# Patient Record
Sex: Male | Born: 1940 | Race: Black or African American | Hispanic: No | Marital: Single | Smoking: Former smoker
Health system: Southern US, Community
[De-identification: ages and names within clinical notes are randomized; demographics above are authoritative.]

## PROBLEM LIST (undated history)

## (undated) DIAGNOSIS — I10 Essential (primary) hypertension: Secondary | ICD-10-CM

## (undated) HISTORY — PX: ANAL FISSURE REPAIR: SHX2312

## (undated) HISTORY — PX: COLONOSCOPY: SHX174

---

## 1962-07-20 HISTORY — PX: OTHER SURGICAL HISTORY: SHX169

## 2011-03-13 ENCOUNTER — Ambulatory Visit (INDEPENDENT_AMBULATORY_CARE_PROVIDER_SITE_OTHER): Payer: Medicare Other | Admitting: Surgery

## 2011-03-13 ENCOUNTER — Encounter (INDEPENDENT_AMBULATORY_CARE_PROVIDER_SITE_OTHER): Payer: Self-pay | Admitting: Surgery

## 2011-03-13 VITALS — BP 160/94 | HR 62 | Ht 70.0 in | Wt 182.8 lb

## 2011-03-13 DIAGNOSIS — K644 Residual hemorrhoidal skin tags: Secondary | ICD-10-CM

## 2011-03-13 MED ORDER — TRAMADOL HCL 50 MG PO TABS: 50.0000 mg | ORAL_TABLET | Freq: Four times a day (QID) | ORAL | Status: DC | PRN

## 2011-03-13 NOTE — Progress Notes (Signed)
Subjective:     Patient ID: Sufyaan Palma, male   DOB: 03/20/1941, 70 y.o.   MRN: [id number]  HPI The patient presents today due to an anal skin tag. It causes local irritation after bowel movements. He denies any severe pain. He has had an anal fissure in the past.  He denies rectal bleeding or drainage.  Bowel movements are normal.   Review of Systems  Constitutional: Negative.   HENT: Negative.   Eyes: Negative.   Respiratory: Negative.   Cardiovascular: Negative.   Gastrointestinal: Negative.        Objective:   Physical Exam  Constitutional: He appears well-developed and well-nourished.  HENT:  Head: Normocephalic and atraumatic.  Nose: Nose normal.  Eyes: Conjunctivae and EOM are normal. Pupils are equal, round, and reactive to light.  Genitourinary:       The patient has a small skin tag in the left lateral anal canal. No evidence of hemorrhoids. No evidence of anal fissure.  Skin: Skin is warm and dry.       Assessment:     Anal skin tag    Plan:     The patient wishes to have this excised today in the clinic. Is causing local irritation and hygiene issues. I discussed the procedure with the patient. Risks of bleeding, infection, postoperative pain, postoperative drainage from the anal canal and recurrence. He understands the potential risks of procedure and agrees to proceed. Under sterile conditions, 1% lidocaine with epinephrine was used objective the left anal canal. 3 cc were used. Anal skin tag was grasped with pickups and excised with scissors. Hemostasis achieved with pressure. He tolerated the procedure well. He will begin sitz baths 3 times a day. I will give him a prescription for pain medicine. I've asked him to refrain from excessive straining and keep a dry gauze in place. He will return as needed or if he has increasing pain or increasing fever.

## 2011-03-13 NOTE — Progress Notes (Signed)
This encounter was created in error - please disregard.

## 2011-03-13 NOTE — Patient Instructions (Signed)
Sitz baths 2-3 times a day.  Baby wipes.  Avoid straining.  Call if temp greater than 101.5 or increasing pain.  Keep a dry gauze in area as needed.

## 2011-03-13 NOTE — Progress Notes (Signed)
Addended by: Harriette Bouillon A on: 03/13/2011 11:29 AM   Modules accepted: Level of Service, SmartSet

## 2011-03-13 NOTE — Patient Instructions (Signed)
You will be scheduled for surgery.

## 2011-03-13 NOTE — Progress Notes (Signed)
Subjective:     Patient ID: Jonathan Obrien, male   DOB: 26-Apr-1941, 70 y.o.   MRN: [id number]  HPI The patient presents to to rectal pain. He was last seen back in 2010 and he has had a history of anal fissure. Back healed but he had significant hyperactivity of his anal sphincter. Last week while on vacation he began to have more pain after bowel movements. The pain is located in his anal canal. It is severe. There is no associated bleeding or discharge. The pain occurs after bowel movements. His last colonoscopy was in 2006 for small hyperplastic polyps.  Review of Systems  Constitutional: Negative.   HENT: Negative.   Eyes: Negative.   Cardiovascular: Negative.   Gastrointestinal: Positive for rectal pain. Negative for blood in stool.  Genitourinary: Negative.   Musculoskeletal: Negative.   Skin: Negative.   Neurological: Negative.   Hematological: Negative.   Psychiatric/Behavioral: Negative.        Objective:   Physical Exam  Constitutional: He appears well-developed and well-nourished.  HENT:  Head: Normocephalic and atraumatic.  Nose: Nose normal.  Eyes: EOM are normal. Pupils are equal, round, and reactive to light.  Neck: Normal range of motion. Thyromegaly present.  Cardiovascular: Normal rate, regular rhythm and normal heart sounds.  Exam reveals no friction rub.   No murmur heard. Pulmonary/Chest: Effort normal and breath sounds normal.  Abdominal: Soft. Bowel sounds are normal.  Genitourinary:       Increased activity of the internal anal sphincter on digital exam. No mass. No abscess. No fissure. No evidence of hemorrhoid disease.  Skin: Skin is warm and dry.  Psychiatric: He has a normal mood and affect. His behavior is normal. Judgment normal.       Assessment:     Proctalgia paroxysmal    Plan:     I have recommended exam under anesthesia and lateral internal sphincterotomy for paroxysmal proctalgia secondary to hyperactivity of the internal sphincter the  anal canal. Risk of bleeding, infection, incontinence, and recurrence of pain discussed with patient. Medical options discussed with patient. He wishes to proceed with surgery.

## 2011-03-20 ENCOUNTER — Other Ambulatory Visit (INDEPENDENT_AMBULATORY_CARE_PROVIDER_SITE_OTHER): Payer: Self-pay | Admitting: Surgery

## 2011-03-20 ENCOUNTER — Ambulatory Visit
Admission: RE | Admit: 2011-03-20 | Discharge: 2011-03-20 | Disposition: A | Discharge status: Home / Self Care | Payer: Medicare Other | Source: Ambulatory Visit | Attending: Surgery | Admitting: Surgery

## 2011-03-20 DIAGNOSIS — Z01811 Encounter for preprocedural respiratory examination: Secondary | ICD-10-CM

## 2011-03-26 DIAGNOSIS — K602 Anal fissure, unspecified: Secondary | ICD-10-CM

## 2011-09-15 ENCOUNTER — Emergency Department (HOSPITAL_BASED_OUTPATIENT_CLINIC_OR_DEPARTMENT_OTHER)
Admission: EM | Admit: 2011-09-15 | Discharge: 2011-09-15 | Disposition: A | Discharge status: Home / Self Care | Payer: Medicare Other | Attending: Emergency Medicine | Admitting: Emergency Medicine

## 2011-09-15 ENCOUNTER — Encounter (HOSPITAL_BASED_OUTPATIENT_CLINIC_OR_DEPARTMENT_OTHER): Payer: Self-pay

## 2011-09-15 DIAGNOSIS — M778 Other enthesopathies, not elsewhere classified: Secondary | ICD-10-CM

## 2011-09-15 DIAGNOSIS — I10 Essential (primary) hypertension: Secondary | ICD-10-CM

## 2011-09-15 DIAGNOSIS — S139XXA Sprain of joints and ligaments of unspecified parts of neck, initial encounter: Secondary | ICD-10-CM | POA: Insufficient documentation

## 2011-09-15 DIAGNOSIS — M65839 Other synovitis and tenosynovitis, unspecified forearm: Secondary | ICD-10-CM | POA: Insufficient documentation

## 2011-09-15 DIAGNOSIS — S161XXA Strain of muscle, fascia and tendon at neck level, initial encounter: Secondary | ICD-10-CM

## 2011-09-15 DIAGNOSIS — Y92009 Unspecified place in unspecified non-institutional (private) residence as the place of occurrence of the external cause: Secondary | ICD-10-CM | POA: Insufficient documentation

## 2011-09-15 DIAGNOSIS — X58XXXA Exposure to other specified factors, initial encounter: Secondary | ICD-10-CM | POA: Insufficient documentation

## 2011-09-15 NOTE — ED Notes (Signed)
Pt c/o right wrist pain that increases with movement. Also c/o posterior neck pain x 2 weeks.

## 2011-09-15 NOTE — Discharge Instructions (Signed)
Cervical Sprain and Strain A cervical sprain is an injury to the neck. The injury can include either over-stretching or even small tears in the ligaments that hold the bones of the neck in place. A strain affects muscles and tendons. Minor injuries usually only involve ligaments and muscles. Because the different parts of the neck are so close together, more severe injuries can involve both sprain and strain. These injuries can affect the muscles, ligaments, tendons, discs, and nerves in the neck. CAUSES  An injury may be the result of a direct blow or from certain habits that can lead to the symptoms noted above.  Injury from:   Contact sports (such as football, rugby, wrestling, hockey, auto racing, gymnastics, diving, martial arts, and boxing).   Motor vehicle accidents.   Whiplash injuries (see image at right). These are common. They occur when the neck is forcefully whipped or forced backward and/or forward.   Falls.   Lifestyle or awkward postures:   Cradling a telephone between the ear and shoulder.   Sitting in a chair that offers no support.   Working at an ill-designed computer station.   Activities that require hours of repeated or long periods of looking up (stretching the neck backward) or looking down (bending the head/neck forward).  SYMPTOMS   Pain, soreness, stiffness, or burning sensation in the front, back, or sides of the neck. This may develop immediately after injury. Onset of discomfort may also develop slowly and not begin for 24 hours or more.   Shoulder and/or upper back pain.   Limits to the normal movement of the neck.   Headache.   Dizziness.   Weakness and/or abnormal sensation (such as numbness or tingling) of one or both arms and/or hands.   Muscle spasm.   Difficulty with swallowing or chewing.   Tenderness and swelling at the injury site.  DIAGNOSIS  Most of the time, your caregiver can diagnose this problem with a careful history and  examination. The history will include information about known problems (such as arthritis in the neck) or a previous neck injury. X-rays may be ordered to find out if there is a different problem. X-rays can also help to find problems with the bones of the neck not related to the injury or current symptoms. TREATMENT  Several treatment options are available to help pain, spasm, and other symptoms. They include:  Cold helps relieve pain and reduce inflammation. Cold should be applied for 10 to 15 minutes every 2 to 3 hours after any activity that aggravates your symptoms. Use ice packs or an ice massage. Place a towel or cloth in between your skin and the ice pack.   Medication:   Only take over-the-counter or prescription medicines for pain, discomfort, or fever as directed by your caregiver.   Pain relievers or muscle relaxants may be prescribed. Use only as directed and only as much as you need.   Change in the activity that caused the problem. This might include using a headset with a telephone so that the phone is not propped between your ear and shoulder.   Neck collar. Your caregiver may recommend temporary use of a soft cervical collar.   Work station. Changes may be needed in your work place. A better sitting position and/or better posture during work may be part of your treatment.   Physical Therapy. Your caregiver may recommend physical therapy. This can include instructions in the use of stretching and strengthening exercises. Improvement in posture is important.   Exercises and posture training can help stabilize the neck and strengthen muscles and keep symptoms from returning.  HOME CARE INSTRUCTIONS  Other than formal physical therapy, all treatments above can be done at home. Even when not at work, it is important to be conscious of your posture and of activities that can cause a return of symptoms. Most cervical sprains and/or strains are better in 1-3 weeks. As you improve and  increase activities, doing a warm up and stretching before the activity will help prevent recurrent problems. SEEK MEDICAL CARE IF:   Pain is not effectively controlled with medication.   You feel unable to decrease pain medication over time as planned.   Activity level is not improving as planned and/or expected.  SEEK IMMEDIATE MEDICAL CARE IF:   While using medication, you develop any bleeding, stomach upset, or signs of an allergic reaction.   Symptoms get worse, become intolerable, and are not helped by medications.   New, unexplained symptoms develop.   You experience numbness, tingling, weakness, or paralysis of any part of your body.  MAKE SURE YOU:   Understand these instructions.   Will watch your condition.   Will get help right away if you are not doing well or get worse.  Document Released: 05/03/2007 Document Revised: 03/18/2011 Document Reviewed: 05/03/2007 [facility] Patient Information 2012 [place], [place].Arterial Hypertension Arterial hypertension (high blood pressure) is a condition of elevated pressure in your blood vessels. Hypertension over a long period of time is a risk factor for strokes, heart attacks, and heart failure. It is also the leading cause of kidney (renal) failure.  CAUSES   In Adults -- Over 90% of all hypertension has no known cause. This is called essential or primary hypertension. In the other 10% of people with hypertension, the increase in blood pressure is caused by another disorder. This is called secondary hypertension. Important causes of secondary hypertension are:   Heavy alcohol use.   Obstructive sleep apnea.   Hyperaldosterosim (Conn's syndrome).   Steroid use.   Chronic kidney failure.   Hyperparathyroidism.   Medications.   Renal artery stenosis.   Pheochromocytoma.   Cushing's disease.   Coarctation of the aorta.   Scleroderma renal crisis.   Licorice (in excessive amounts).   Drugs (cocaine,  methamphetamine).  Your caregiver can explain any items above that apply to you.  In Children -- Secondary hypertension is more common and should always be considered.   Pregnancy -- Few women of childbearing age have high blood pressure. However, up to 10% of them develop hypertension of pregnancy. Generally, this will not harm the woman. It may be a sign of 3 complications of pregnancy: preeclampsia, HELLP syndrome, and eclampsia. Follow up and control with medication is necessary.  SYMPTOMS   This condition normally does not produce any noticeable symptoms. It is usually found during a routine exam.   Malignant hypertension is a late problem of high blood pressure. It may have the following symptoms:   Headaches.   Blurred vision.   End-organ damage (this means your kidneys, heart, lungs, and other organs are being damaged).   Stressful situations can increase the blood pressure. If a person with normal blood pressure has their blood pressure go up while being seen by their caregiver, this is often termed "white coat hypertension." Its importance is not known. It may be related with eventually developing hypertension or complications of hypertension.   Hypertension is often confused with mental tension, stress, and anxiety.  DIAGNOSIS  The diagnosis is made by 3 separate blood pressure measurements. They are taken at least 1 week apart from each other. If there is organ damage from hypertension, the diagnosis may be made without repeat measurements. Hypertension is usually identified by having blood pressure readings:  Above 140/90 mmHg measured in both arms, at 3 separate times, over a couple weeks.   Over 130/80 mmHg should be considered a risk factor and may require treatment in patients with diabetes.  Blood pressure readings over 120/80 mmHg are called "pre-hypertension" even in non-diabetic patients. To get a true blood pressure measurement, use the following guidelines. Be  aware of the factors that can alter blood pressure readings.  Take measurements at least 1 hour after caffeine.   Take measurements 30 minutes after smoking and without any stress. This is another reason to quit smoking - it raises your blood pressure.   Use a proper cuff size. Ask your caregiver if you are not sure about your cuff size.   Most home blood pressure cuffs are automatic. They will measure systolic and diastolic pressures. The systolic pressure is the pressure reading at the start of sounds. Diastolic pressure is the pressure at which the sounds disappear. If you are elderly, measure pressures in multiple postures. Try sitting, lying or standing.   Sit at rest for a minimum of 5 minutes before taking measurements.   You should not be on any medications like decongestants. These are found in many cold medications.   Record your blood pressure readings and review them with your caregiver.  If you have hypertension:  Your caregiver may do tests to be sure you do not have secondary hypertension (see "causes" above).   Your caregiver may also look for signs of metabolic syndrome. This is also called Syndrome X or Insulin Resistance Syndrome. You may have this syndrome if you have type 2 diabetes, abdominal obesity, and abnormal blood lipids in addition to hypertension.   Your caregiver will take your medical and family history and perform a physical exam.   Diagnostic tests may include blood tests (for glucose, cholesterol, potassium, and kidney function), a urinalysis, or an EKG. Other tests may also be necessary depending on your condition.  PREVENTION  There are important lifestyle issues that you can adopt to reduce your chance of developing hypertension:  Maintain a normal weight.   Limit the amount of salt (sodium) in your diet.   Exercise often.   Limit alcohol intake.   Get enough potassium in your diet. Discuss specific advice with your caregiver.   Follow a DASH  diet (dietary approaches to stop hypertension). This diet is rich in fruits, vegetables, and low-fat dairy products, and avoids certain fats.  PROGNOSIS  Essential hypertension cannot be cured. Lifestyle changes and medical treatment can lower blood pressure and reduce complications. The prognosis of secondary hypertension depends on the underlying cause. Many people whose hypertension is controlled with medicine or lifestyle changes can live a normal, healthy life.  RISKS AND COMPLICATIONS  While high blood pressure alone is not an illness, it often requires treatment due to its short- and long-term effects on many organs. Hypertension increases your risk for:  CVAs or strokes (cerebrovascular accident).   Heart failure due to chronically high blood pressure (hypertensive cardiomyopathy).   Heart attack (myocardial infarction).   Damage to the retina (hypertensive retinopathy).   Kidney failure (hypertensive nephropathy).  Your caregiver can explain list items above that apply to you. Treatment of hypertension can significantly reduce  the risk of complications. TREATMENT   For overweight patients, weight loss and regular exercise are recommended. Physical fitness lowers blood pressure.   Mild hypertension is usually treated with diet and exercise. A diet rich in fruits and vegetables, fat-free dairy products, and foods low in fat and salt (sodium) can help lower blood pressure. Decreasing salt intake decreases blood pressure in a 1/3 of people.   Stop smoking if you are a smoker.  The steps above are highly effective in reducing blood pressure. While these actions are easy to suggest, they are difficult to achieve. Most patients with moderate or severe hypertension end up requiring medications to bring their blood pressure down to a normal level. There are several classes of medications for treatment. Blood pressure pills (antihypertensives) will lower blood pressure by their different  actions. Lowering the blood pressure by 10 mmHg may decrease the risk of complications by as much as 25%. The goal of treatment is effective blood pressure control. This will reduce your risk for complications. Your caregiver will help you determine the best treatment for you according to your lifestyle. What is excellent treatment for one person, may not be for you. HOME CARE INSTRUCTIONS   Do not smoke.   Follow the lifestyle changes outlined in the "Prevention" section.   If you are on medications, follow the directions carefully. Blood pressure medications must be taken as prescribed. Skipping doses reduces their benefit. It also puts you at risk for problems.   Follow up with your caregiver, as directed.   If you are asked to monitor your blood pressure at home, follow the guidelines in the "Diagnosis" section above.  SEEK MEDICAL CARE IF:   You think you are having medication side effects.   You have recurrent headaches or lightheadedness.   You have swelling in your ankles.   You have trouble with your vision.  SEEK IMMEDIATE MEDICAL CARE IF:   You have sudden onset of chest pain or pressure, difficulty breathing, or other symptoms of a heart attack.   You have a severe headache.   You have symptoms of a stroke (such as sudden weakness, difficulty speaking, difficulty walking).  MAKE SURE YOU:   Understand these instructions.   Will watch your condition.   Will get help right away if you are not doing well or get worse.  Document Released: 07/06/2005 Document Revised: 03/18/2011 Document Reviewed: 02/03/2007 [facility] Patient Information 2012 [place], [place].Hypertension Information As your heart beats, it forces blood through your arteries. This force is your blood pressure. If the pressure is too high, it is called hypertension (HTN) or high blood pressure. HTN is dangerous because you may have it and not know it. High blood pressure may mean that your heart has to work  harder to pump blood. Your arteries may be narrow or stiff. The extra work puts you at risk for heart disease, stroke, and other problems.  Blood pressure consists of two numbers, a higher number over a lower, 110/72, for example. It is stated as "110 over 72." The ideal is below 120 for the top number (systolic) and under 80 for the bottom (diastolic).  You should pay close attention to your blood pressure if you have certain conditions such as:  Heart failure.   Prior heart attack.   Diabetes   Chronic kidney disease.   Prior stroke.   Multiple risk factors for heart disease.  To see if you have HTN, your blood pressure should be measured while you are  seated with your arm held at the level of the heart. It should be measured at least twice. A one-time elevated blood pressure reading (especially in the Emergency Department) does not mean that you need treatment. There may be conditions in which the blood pressure is different between your right and left arms. It is important to see your caregiver soon for a recheck. Most people have essential hypertension which means that there is not a specific cause. This type of high blood pressure may be lowered by changing lifestyle factors such as:  Stress.   Smoking.   Lack of exercise.   Excessive weight.   Drug/tobacco/alcohol use.   Eating less salt.  Most people do not have symptoms from high blood pressure until it has caused damage to the body. Effective treatment can often prevent, delay or reduce that damage. TREATMENT  Treatment for high blood pressure, when a cause has been identified, is directed at the cause. There are a large number of medications to treat HTN. These fall into several categories, and your caregiver will help you select the medicines that are best for you. Medications may have side effects. You should review side effects with your caregiver. If your blood pressure stays high after you have made lifestyle changes  or started on medicines,   Your medication(s) may need to be changed.   Other problems may need to be addressed.   Be certain you understand your prescriptions, and know how and when to take your medicine.   Be sure to follow up with your caregiver within the time frame advised (usually within two weeks) to have your blood pressure rechecked and to review your medications.   If you are taking more than one medicine to lower your blood pressure, make sure you know how and at what times they should be taken. Taking two medicines at the same time can result in blood pressure that is too low.  Document Released: 09/08/2005 Document Revised: 03/18/2011 Document Reviewed: 09/15/2007 [facility] Patient Information 2012 [place], [place].Tendinitis Tendinitis is swelling and inflammation of the tendons. Tendons are band-like tissues that connect muscle to bone. Tendinitis commonly occurs in the:   Shoulders (rotator cuff).   Heels (Achilles tendon).   Elbows (triceps tendon).  CAUSES Tendinitis is usually caused by overusing the tendon, muscles, and joints involved. When the tissue surrounding a tendon (synovium) becomes inflamed, it is called tenosynovitis. Tendinitis commonly develops in people whose jobs require repetitive motions. SYMPTOMS  Pain.   Tenderness.   Mild swelling.  DIAGNOSIS Tendinitis is usually diagnosed by physical exam. Your caregiver may also order X-rays or other imaging tests. TREATMENT Your caregiver may recommend certain medicines or exercises for your treatment. HOME CARE INSTRUCTIONS   Use a sling or splint for as long as directed by your caregiver until the pain decreases.   Put ice on the injured area.   Put ice in a plastic bag.   Place a towel between your skin and the bag.   Leave the ice on for 15 to 20 minutes, 3 to 4 times a day.   Avoid using the limb while the tendon is painful. Perform gentle range of motion exercises only as directed by your  caregiver. Stop exercises if pain or discomfort increase, unless directed otherwise by your caregiver.   Only take over-the-counter or prescription medicines for pain, discomfort, or fever as directed by your caregiver.  SEEK MEDICAL CARE IF:   Your pain and swelling increase.   You develop new,  unexplained symptoms, especially increased numbness in the hands.  MAKE SURE YOU:   Understand these instructions.   Will watch your condition.   Will get help right away if you are not doing well or get worse.  Document Released: 07/03/2000 Document Revised: 03/18/2011 Document Reviewed: 09/22/2010 [facility] Patient Information 2012 [place], [place].

## 2011-09-15 NOTE — ED Provider Notes (Signed)
History     CSN: [id number]  Arrival date & time 09/15/11  1124   First MD Initiated Contact with Patient 09/15/11 1218      Chief Complaint  Patient presents with  . 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 No language interpreter was used.    History reviewed. No pertinent past medical history.  Past Surgical History  Procedure Date  . Anal fissure repair   . Left leg surgery 1964    No family history on file.  History  Substance Use Topics  . Smoking status: Former Smoker    Quit date: 03/12/1981  . Smokeless tobacco: Not on file  . Alcohol Use: No      Review of Systems  All other systems reviewed and are negative.    Allergies  Codeine and Cortizone-5  Home Medications   Current Outpatient Rx  Name Route Sig Dispense Refill  . TRAMADOL HCL 50 MG PO TABS Oral Take 1 tablet (50 mg total) by mouth every 6 (six) hours as needed for pain. 20 tablet 0    There were no vitals taken for this visit.  Physical Exam  Nursing note and vitals reviewed. Constitutional: He is oriented to person, place, and time. He appears well-developed and well-nourished.  HENT:  Head: Normocephalic and atraumatic.  Eyes: Conjunctivae and EOM are normal. Pupils are equal, round, and reactive to light.  Neck: Normal range of motion. Neck supple.  Cardiovascular: Normal rate and regular rhythm.   Pulmonary/Chest: Effort normal and breath sounds normal.  Musculoskeletal: Normal range of  motion.       Pt tender on the right lateral neck:pt has full rom:pt tender on the left lateral wrist:no swelling noted:pt has rull rom  Neurological: He is alert and oriented to person, place, and time.  Skin: Skin is warm and dry.  Psychiatric: He has a normal mood and affect.    ED Course  Procedures (including critical care time)  Labs Reviewed - No data to display No results found.   1. Tendonitis of wrist, left   2. Cervical strain   3. Hypertension       MDM  Don't think imaging is needed at this time:likely tendonitis of wrist based on use:pt like has cervical strain:pt has not had fall or injury:pt refusing pain medication:discussed bp with pt        Teressa Lower, NP 09/15/11 1242

## 2011-09-16 NOTE — ED Provider Notes (Signed)
Medical screening examination/treatment/procedure(s) were performed by non-physician practitioner and as supervising physician I was immediately available for consultation/collaboration.   Geoffery Lyons, MD 09/16/11 1640

## 2011-09-17 ENCOUNTER — Telehealth: Payer: Self-pay | Admitting: Internal Medicine

## 2011-09-17 ENCOUNTER — Encounter: Payer: Self-pay | Admitting: Internal Medicine

## 2011-09-17 ENCOUNTER — Ambulatory Visit (INDEPENDENT_AMBULATORY_CARE_PROVIDER_SITE_OTHER): Payer: Medicare Other | Admitting: Internal Medicine

## 2011-09-17 DIAGNOSIS — I10 Essential (primary) hypertension: Secondary | ICD-10-CM | POA: Insufficient documentation

## 2011-09-17 DIAGNOSIS — Z79899 Other long term (current) drug therapy: Secondary | ICD-10-CM

## 2011-09-17 DIAGNOSIS — Z13220 Encounter for screening for lipoid disorders: Secondary | ICD-10-CM

## 2011-09-17 LAB — CBC WITH DIFFERENTIAL/PLATELET
Basophils Absolute: 0 10*3/uL (ref 0.0–0.1)
Basophils Relative: 1 % (ref 0–1)
Lymphocytes Relative: 30 % (ref 12–46)
MCHC: 31.8 g/dL (ref 30.0–36.0)
Neutro Abs: 3.2 10*3/uL (ref 1.7–7.7)
Neutrophils Relative %: 53 % (ref 43–77)
Platelets: 204 10*3/uL (ref 150–400)
RDW: 15.9 % — ABNORMAL HIGH (ref 11.5–15.5)
WBC: 6.1 10*3/uL (ref 4.0–10.5)

## 2011-09-17 LAB — BASIC METABOLIC PANEL
BUN: 14 mg/dL (ref 6–23)
Potassium: 4.6 mEq/L (ref 3.5–5.3)
Sodium: 139 mEq/L (ref 135–145)

## 2011-09-17 MED ORDER — AMLODIPINE BESYLATE 5 MG PO TABS: 5.0000 mg | ORAL_TABLET | Freq: Every day | ORAL | Status: DC

## 2011-09-17 NOTE — Telephone Encounter (Signed)
Lab order entered for April 2013.  

## 2011-09-17 NOTE — Patient Instructions (Signed)
Please schedule fasting lipid prior to next visit (401.9)

## 2011-09-17 NOTE — Progress Notes (Signed)
  Subjective:    Patient ID: Jonathan Obrien, male    DOB: [birth year], 71 y.o.   MRN: [id number]  HPI Pt presents to clinic for evaluation of elevated bp. Notes chronic h/o HTN for at least years. Has never been treated. Is asx without ha, dizziness, cp, neurologic sx or dyspnea. Recently seen in ED with wrist and neck pain. dx'ed with tendinitis and neck strain. Wrist pain resolved with nsaid and neck pain improved. No radicular arm pain or injury. Taking no medication currently for the problem but using heat. bp in ED was significantly elevated near sbp 200. Self monitoring values are sbp ~160. No other complaints.  PMH:h/o anal fissure HTN  Past Surgical History  Procedure Date  . Anal fissure repair   . Left leg surgery 1964  . Colonoscopy     reports that he quit smoking about 30 years ago. He has never used smokeless tobacco. He reports that he drinks alcohol. He reports that he does not use illicit drugs. family history includes Diabetes in his brother.  There is no history of Prostate cancer, and Hypertension, and Breast cancer, and Colon cancer, and Heart disease, . Allergies  Allergen Reactions  . Codeine Rash    With aspirin  . Cortizone-5 (Hydrocortisone Base) Rash     Review of Systems  Constitutional: Negative for fatigue.  Respiratory: Negative for shortness of breath.   Cardiovascular: Negative for chest pain.  Neurological: Negative for dizziness and headaches.  All other systems reviewed and are negative.       Objective:   Physical Exam  Nursing note and vitals reviewed. Constitutional: He appears well-developed and well-nourished. No distress.  HENT:  Head: Normocephalic and atraumatic.  Right Ear: External ear normal.  Left Ear: External ear normal.  Mouth/Throat: Oropharynx is clear and moist. No oropharyngeal exudate.  Eyes: Conjunctivae and EOM are normal. Pupils are equal, round, and reactive to light. No scleral icterus.  Neck: Neck supple. No JVD  present. Carotid bruit is not present. No thyromegaly present.  Cardiovascular: Normal rate, regular rhythm and normal heart sounds.  Exam reveals no gallop and no friction rub.   No murmur heard. Pulmonary/Chest: Effort normal and breath sounds normal. No respiratory distress. He has no wheezes. He has no rales.  Lymphadenopathy:    He has no cervical adenopathy.  Neurological: He is alert.  Skin: Skin is warm and dry. He is not diaphoretic.  Psychiatric: He has a normal mood and affect.          Assessment & Plan:

## 2011-09-17 NOTE — Assessment & Plan Note (Signed)
Long discussion held regarding diagnosis, treatment and potential complications of untreated HTN. Obtain cbc, chem7. EKG obtained demonstrating nsr with nl intervals and axis. Discussed low sodium diet and exercise. Recommend begin medication and states will consider. Provided with prescription for norvasc. Monitor bp as outpt and follow up in clinic as scheduled.

## 2011-10-26 ENCOUNTER — Ambulatory Visit (INDEPENDENT_AMBULATORY_CARE_PROVIDER_SITE_OTHER): Payer: Medicare Other | Admitting: Internal Medicine

## 2011-10-26 ENCOUNTER — Encounter: Payer: Self-pay | Admitting: Internal Medicine

## 2011-10-26 VITALS — BP 172/100 | HR 62 | Temp 98.2°F | Resp 18 | Ht 70.0 in | Wt 190.0 lb

## 2011-10-26 DIAGNOSIS — I10 Essential (primary) hypertension: Secondary | ICD-10-CM

## 2011-10-26 MED ORDER — AMLODIPINE BESYLATE 10 MG PO TABS: 10.0000 mg | ORAL_TABLET | Freq: Every day | ORAL | Status: DC

## 2011-10-26 NOTE — Patient Instructions (Signed)
Please schedule fasting lipid- 401.9 prior to next appointment

## 2011-10-26 NOTE — Progress Notes (Signed)
  Subjective:    Patient ID: Jonathan Obrien, male    DOB: [birth year], 71 y.o.   MRN: [id number]  HPI Pt presents to clinic for follow up of HTN. Tolerating norvasc without side effect. Reviewed bp log-~150-160 with occasional higher or lower numbers. Has been taking norvasc consistently for 3 weeks and recent lost his mother. Hasn't been able to exercise recently. No active complaint.  No past medical history on file. Past Surgical History  Procedure Date  . Anal fissure repair   . Left leg surgery 1964  . Colonoscopy     reports that he quit smoking about 30 years ago. He has never used smokeless tobacco. He reports that he drinks alcohol. He reports that he does not use illicit drugs. family history includes Diabetes in his brother.  There is no history of Prostate cancer, and Hypertension, and Breast cancer, and Colon cancer, and Heart disease, . Allergies  Allergen Reactions  . Codeine Rash    With aspirin  . Cortizone-5 (Hydrocortisone Base) Rash     Review of Systems see hpi     Objective:   Physical Exam  Physical Exam  Nursing note and vitals reviewed. Constitutional: Appears well-developed and well-nourished. No distress.  HENT:  Head: Normocephalic and atraumatic.  Right Ear: External ear normal.  Left Ear: External ear normal.  Eyes: Conjunctivae are normal. No scleral icterus.  Neck: Neck supple. Carotid bruit is not present.  Cardiovascular: Normal rate, regular rhythm and normal heart sounds.  Exam reveals no gallop and no friction rub.   No murmur heard. Pulmonary/Chest: Effort normal and breath sounds normal. No respiratory distress. He has no wheezes. no rales.  Lymphadenopathy:    He has no cervical adenopathy.  Neurological:Alert.  Skin: Skin is warm and dry. Not diaphoretic.  Psychiatric: Has a normal mood and affect.       Assessment & Plan:

## 2011-10-26 NOTE — Assessment & Plan Note (Signed)
suboptimal control. Asx. Increase norvasc 10mg  qd. Call bp log to clinic ~2wks. Obtain lipid profile prior to next visit

## 2011-11-18 ENCOUNTER — Ambulatory Visit (INDEPENDENT_AMBULATORY_CARE_PROVIDER_SITE_OTHER): Payer: Medicare Other | Admitting: Family

## 2011-11-18 ENCOUNTER — Encounter: Payer: Self-pay | Admitting: Family

## 2011-11-18 VITALS — BP 180/84 | HR 60 | Temp 97.9°F | Resp 16 | Ht 70.0 in | Wt 190.0 lb

## 2011-11-18 DIAGNOSIS — I10 Essential (primary) hypertension: Secondary | ICD-10-CM

## 2011-11-18 MED ORDER — LISINOPRIL-HYDROCHLOROTHIAZIDE 10-12.5 MG PO TABS: 1.0000 | ORAL_TABLET | Freq: Every day | ORAL | Status: DC

## 2011-11-18 NOTE — Patient Instructions (Addendum)
Please follow up with Dr. Rodena Medin in 2 weeks.

## 2011-11-18 NOTE — Assessment & Plan Note (Signed)
Deteriorated.  HR is 60 today- will avoid addition of beta blocker. Will continue amlodipine, add lisinopril/hctz 10/12.5.  Follow up in 2 weeks with Dr. Rodena Medin for follow up BP and BMET.  He was advised on rare chance of angioedema on ACE.  Instructed to discontinue and to the ED immediately if this occurs.  Pt verbalizes understanding.

## 2011-11-18 NOTE — Progress Notes (Signed)
  Subjective:    Patient ID: Jonathan Obrien, male    DOB: April 22, 1941, 71 y.o.   MRN: [id number]  HPI  Jonathan Obrien is a 71 yr old male who presents today to discuss his elevated blood pressure.    HTN- Last visit pt's blood pressure was noted to be above goal.  At that time his amlodipine was increased from 5mg  to 10mg .  He reports tolerating this increased dose without difficulty. However, yesterday he checked his BP with his home meter and reports reading of 221/99.  Denies HA, edema.  He denies shortness of breath or chest pain.  Took last dose of amlodipine yesterday evening.  Review of Systems See HPI  No past medical history on file.  History   Social History  . Marital Status: Single    Spouse Name: N/A    Number of Children: N/A  . Years of Education: N/A   Occupational History  . Not on file.   Social History Main Topics  . Smoking status: Former Smoker    Quit date: 03/12/1981  . Smokeless tobacco: Never Used  . Alcohol Use: Yes     occasional  . Drug Use: No  . Sexually Active: Not on file   Other Topics Concern  . Not on file   Social History Narrative  . No narrative on file    Past Surgical History  Procedure Date  . Anal fissure repair   . Left leg surgery 1964  . Colonoscopy     Family History  Problem Relation Age of Onset  . Prostate cancer Neg Hx   . Diabetes Brother   . Hypertension Neg Hx   . Breast cancer Neg Hx   . Colon cancer Neg Hx   . Heart disease Neg Hx     Allergies  Allergen Reactions  . Codeine Rash    With aspirin  . Cortizone-5 (Hydrocortisone Base) Rash    Current Outpatient Prescriptions on File Prior to Visit  Medication Sig Dispense Refill  . amLODipine (NORVASC) 10 MG tablet Take 1 tablet (10 mg total) by mouth daily.  30 tablet  11  . lisinopril-hydrochlorothiazide (PRINZIDE,ZESTORETIC) 10-12.5 MG per tablet Take 1 tablet by mouth daily.  30 tablet  1    BP 180/84  Pulse 60  Temp(Src) 97.9 F (36.6 C)  (Oral)  Resp 16  Ht 5\' 10"  (1.778 m)  Wt 190 lb (86.183 kg)  BMI 27.26 kg/m2  SpO2 99%       Objective:   Physical Exam  Constitutional: He appears well-developed and well-nourished. No distress.  Cardiovascular: Normal rate and regular rhythm.   No murmur heard. Pulmonary/Chest: Effort normal and breath sounds normal. No respiratory distress. He has no wheezes. He has no rales. He exhibits no tenderness.  Musculoskeletal: He exhibits no edema.  Psychiatric: He has a normal mood and affect. His behavior is normal. Judgment and thought content normal.          Assessment & Plan:

## 2011-12-03 ENCOUNTER — Encounter: Payer: Self-pay | Admitting: Internal Medicine

## 2011-12-03 ENCOUNTER — Ambulatory Visit (INDEPENDENT_AMBULATORY_CARE_PROVIDER_SITE_OTHER): Payer: Medicare Other | Admitting: Internal Medicine

## 2011-12-03 VITALS — BP 150/82 | HR 60 | Temp 98.0°F | Resp 16 | Wt 186.0 lb

## 2011-12-03 DIAGNOSIS — I10 Essential (primary) hypertension: Secondary | ICD-10-CM

## 2011-12-03 NOTE — Assessment & Plan Note (Signed)
BP rechecked myself with noted possible phase shift. Initial sound with sbp 160 with second prominent sound 136. SBP by radial palpation ~136. Recommend continuation of mono therapy with close monitoring. Previous elevations likely contributed to by stressors as well as phase shift.

## 2011-12-03 NOTE — Progress Notes (Signed)
  Subjective:    Patient ID: Jonathan Obrien, male    DOB: 10/31/1940, 71 y.o.   MRN: [id number]  HPI Pt presents to clinic for follow up of HTN. Recently seen with sbp elevations of 180-200 without sx's. Ace/hctz added to norvasc. Returns today stating did not begin the medication and bp's have returned to normal. Home monitoring over last several days 110's/80's. States increased stress recently including the passing of his mother just prior to Mother's Day. No other complaints. Total time of visit ~15 minutes of which >50% spent in counseling.  No past medical history on file. Past Surgical History  Procedure Date  . Anal fissure repair   . Left leg surgery 1964  . Colonoscopy     reports that he quit smoking about 30 years ago. He has never used smokeless tobacco. He reports that he drinks alcohol. He reports that he does not use illicit drugs. family history includes Diabetes in his brother.  There is no history of Prostate cancer, and Hypertension, and Breast cancer, and Colon cancer, and Heart disease, . Allergies  Allergen Reactions  . Codeine Rash    With aspirin  . Cortizone-5 (Hydrocortisone Base) Rash     Review of Systems see hpi     Objective:   Physical Exam  Constitutional: He appears well-developed and well-nourished. No distress.  HENT:  Head: Normocephalic and atraumatic.  Right Ear: External ear normal.  Left Ear: External ear normal.  Eyes: Conjunctivae are normal. No scleral icterus.  Neck: Neck supple.  Cardiovascular: Normal rate, regular rhythm and normal heart sounds.  Exam reveals no gallop and no friction rub.   No murmur heard. Pulmonary/Chest: Effort normal and breath sounds normal. No respiratory distress. He has no wheezes. He has no rales.  Neurological: He is alert.  Skin: Skin is warm and dry. He is not diaphoretic.  Psychiatric: He has a normal mood and affect.          Assessment & Plan:

## 2011-12-29 ENCOUNTER — Ambulatory Visit (INDEPENDENT_AMBULATORY_CARE_PROVIDER_SITE_OTHER): Payer: Medicare Other | Admitting: Internal Medicine

## 2011-12-29 ENCOUNTER — Encounter: Payer: Self-pay | Admitting: Internal Medicine

## 2011-12-29 VITALS — BP 150/80 | HR 65 | Temp 98.5°F | Resp 18 | Ht 70.0 in | Wt 187.0 lb

## 2011-12-29 DIAGNOSIS — Z13220 Encounter for screening for lipoid disorders: Secondary | ICD-10-CM

## 2011-12-29 DIAGNOSIS — I10 Essential (primary) hypertension: Secondary | ICD-10-CM

## 2011-12-29 LAB — LIPID PANEL
HDL: 39 mg/dL — ABNORMAL LOW (ref >39)
LDL Cholesterol: 172 mg/dL — ABNORMAL HIGH (ref 0–99)
Total CHOL/HDL Ratio: 6.3 Ratio
Triglycerides: 169 mg/dL — ABNORMAL HIGH (ref <150)
VLDL: 34 mg/dL (ref 0–40)

## 2011-12-29 NOTE — Assessment & Plan Note (Signed)
Normotensive and stable. Continue current regimen. Monitor bp as outpt and followup in clinic as scheduled. Obtain lipid profile

## 2011-12-29 NOTE — Progress Notes (Signed)
  Subjective:    Patient ID: Jonathan Obrien, male    DOB: 03/21/1941, 71 y.o.   MRN: [id number]  HPI Pt presents to clinic for follow up of HTN. bp repeated to be sbp 132 auscultation and by radial palpation. Has phase shift though less noticeable today. Notes home bp ~120's last check. Compliant with medication without adverse effect. No active complaints.   No past medical history on file. Past Surgical History  Procedure Date  . Anal fissure repair   . Left leg surgery 1964  . Colonoscopy     reports that he quit smoking about 30 years ago. He has never used smokeless tobacco. He reports that he drinks alcohol. He reports that he does not use illicit drugs. family history includes Diabetes in his brother.  There is no history of Prostate cancer, and Hypertension, and Breast cancer, and Colon cancer, and Heart disease, . Allergies  Allergen Reactions  . Codeine Rash    With aspirin  . Cortizone-5 (Hydrocortisone Base) Rash     Review of Systems see hpi     Objective:   Physical Exam  Physical Exam  Nursing note and vitals reviewed. Constitutional: Appears well-developed and well-nourished. No distress.  HENT:  Head: Normocephalic and atraumatic.  Right Ear: External ear normal.  Left Ear: External ear normal.  Eyes: Conjunctivae are normal. No scleral icterus.  Neck: Neck supple. Carotid bruit is not present.  Cardiovascular: Normal rate, regular rhythm and normal heart sounds.  Exam reveals no gallop and no friction rub.   No murmur heard. Pulmonary/Chest: Effort normal and breath sounds normal. No respiratory distress. He has no wheezes. no rales.  Lymphadenopathy:    He has no cervical adenopathy.  Neurological:Alert.  Skin: Skin is warm and dry. Not diaphoretic.  Psychiatric: Has a normal mood and affect.        Assessment & Plan:

## 2012-01-06 ENCOUNTER — Other Ambulatory Visit: Payer: Self-pay | Admitting: Internal Medicine

## 2012-01-06 DIAGNOSIS — E785 Hyperlipidemia, unspecified: Secondary | ICD-10-CM

## 2012-04-06 ENCOUNTER — Encounter: Payer: Self-pay | Admitting: Family

## 2012-04-06 ENCOUNTER — Ambulatory Visit (INDEPENDENT_AMBULATORY_CARE_PROVIDER_SITE_OTHER): Payer: Medicare Other | Admitting: Family

## 2012-04-06 VITALS — BP 180/90 | HR 58 | Temp 98.6°F | Resp 16 | Wt 184.1 lb

## 2012-04-06 DIAGNOSIS — I10 Essential (primary) hypertension: Secondary | ICD-10-CM

## 2012-04-06 DIAGNOSIS — M129 Arthropathy, unspecified: Secondary | ICD-10-CM

## 2012-04-06 DIAGNOSIS — M1990 Unspecified osteoarthritis, unspecified site: Secondary | ICD-10-CM | POA: Insufficient documentation

## 2012-04-06 MED ORDER — MELOXICAM 7.5 MG PO TABS: 7.5000 mg | ORAL_TABLET | Freq: Every day | ORAL | Status: DC

## 2012-04-06 NOTE — Assessment & Plan Note (Signed)
Short course of meloxicam.  Should also help with left heel pain.

## 2012-04-06 NOTE — Patient Instructions (Addendum)
Please call if symptoms worsen or if no improvement in 2 weeks.

## 2012-04-06 NOTE — Assessment & Plan Note (Signed)
BP up- pt reports that he forgot BP med, plans to take dose as soon as he leaves today.

## 2012-04-06 NOTE — Progress Notes (Signed)
  Subjective:    Patient ID: Jonathan Obrien, male    DOB: [birth year], 71 y.o.   MRN: [id number]  HPI  [name] is a 71 yr old male who presents today with chief complaint of right knee pain.   R knee cap pain x 3 days.  He has not tried any meds. Denies swelling.    Left heel- sharp shooting pain.  Hx of injury to heel.  Improves with elevation. Has bothered him off/on for a while, but most recently started to act up more.   Review of Systems See HPI  No past medical history on file.  History   Social History  . Marital Status: Single    Spouse Name: N/A    Number of Children: N/A  . Years of Education: N/A   Occupational History  . Not on file.   Social History Main Topics  . Smoking status: Former Smoker    Quit date: 03/12/1981  . Smokeless tobacco: Never Used  . Alcohol Use: Yes     occasional  . Drug Use: No  . Sexually Active: Not on file   Other Topics Concern  . Not on file   Social History Narrative  . No narrative on file    Past Surgical History  Procedure Date  . Anal fissure repair   . Left leg surgery 1964  . Colonoscopy     Family History  Problem Relation Age of Onset  . Prostate cancer Neg Hx   . Diabetes Brother   . Hypertension Neg Hx   . Breast cancer Neg Hx   . Colon cancer Neg Hx   . Heart disease Neg Hx     Allergies  Allergen Reactions  . Codeine Rash    With aspirin  . Cortizone-5 (Hydrocortisone Base) Rash    Current Outpatient Prescriptions on File Prior to Visit  Medication Sig Dispense Refill  . amLODipine (NORVASC) 10 MG tablet Take 1 tablet (10 mg total) by mouth daily.  30 tablet  11    BP 180/90  Pulse 58  Temp 98.6 F (37 C) (Oral)  Resp 16  Wt 184 lb 1.9 oz (83.516 kg)  SpO2 99%       Objective:   Physical Exam  Constitutional: He appears well-developed and well-nourished. No distress.  Cardiovascular: Normal rate and regular rhythm.   No murmur heard. Pulmonary/Chest: Effort normal and  breath sounds normal.  Musculoskeletal:       R knee, non-tender.  Full rom, no redness or swelling noted.   Left posterior lower calf- old scar from laceration- no swelling.           Assessment & Plan:

## 2012-05-02 ENCOUNTER — Ambulatory Visit (HOSPITAL_BASED_OUTPATIENT_CLINIC_OR_DEPARTMENT_OTHER)
Admission: RE | Admit: 2012-05-02 | Discharge: 2012-05-02 | Disposition: A | Discharge status: Home / Self Care | Payer: Medicare Other | Source: Ambulatory Visit | Attending: Internal Medicine | Admitting: Internal Medicine

## 2012-05-02 ENCOUNTER — Encounter: Payer: Self-pay | Admitting: Internal Medicine

## 2012-05-02 ENCOUNTER — Ambulatory Visit (INDEPENDENT_AMBULATORY_CARE_PROVIDER_SITE_OTHER): Payer: Medicare Other | Admitting: Internal Medicine

## 2012-05-02 VITALS — BP 170/82 | HR 80 | Temp 98.6°F | Resp 16 | Wt 187.0 lb

## 2012-05-02 DIAGNOSIS — IMO0002 Reserved for concepts with insufficient information to code with codable children: Secondary | ICD-10-CM

## 2012-05-02 DIAGNOSIS — M51379 Other intervertebral disc degeneration, lumbosacral region without mention of lumbar back pain or lower extremity pain: Secondary | ICD-10-CM | POA: Insufficient documentation

## 2012-05-02 DIAGNOSIS — E785 Hyperlipidemia, unspecified: Secondary | ICD-10-CM

## 2012-05-02 DIAGNOSIS — M5410 Radiculopathy, site unspecified: Secondary | ICD-10-CM

## 2012-05-02 DIAGNOSIS — M5137 Other intervertebral disc degeneration, lumbosacral region: Secondary | ICD-10-CM | POA: Insufficient documentation

## 2012-05-02 DIAGNOSIS — I10 Essential (primary) hypertension: Secondary | ICD-10-CM

## 2012-05-02 MED ORDER — LOSARTAN POTASSIUM 50 MG PO TABS: 50.0000 mg | ORAL_TABLET | Freq: Every day | ORAL | Status: DC

## 2012-05-02 MED ORDER — METHYLPREDNISOLONE 4 MG PO KIT: PACK | ORAL | Status: DC

## 2012-05-02 NOTE — Assessment & Plan Note (Signed)
Obtain lumbar sacral plain radiograph of spine. Attempt Medrol Dosepak. Followup if no improvement or worsening.

## 2012-05-02 NOTE — Assessment & Plan Note (Signed)
Suboptimal control. Add losartan. Monitor blood pressure as an outpatient and followup in clinic as scheduled

## 2012-05-02 NOTE — Progress Notes (Signed)
  Subjective:    Patient ID: Jonathan Obrien, male    DOB: Apr 14, 1941, 71 y.o.   MRN: [id number]  HPI Pt presents to clinic for followup of multiple medical problems. The twenty-two month history of what appears to be the radiating pain down the left leg, the lower aspect. Radiates to the bottom of the foot and has burning sensation and hypersensitivity. Denies back pain or injury. No leg weakness. Denies urinary incontinence. Seen approximately month ago did not take Mobic as prescribed. Blood pressure elevated and rechecked to be 150/70. Home monitoring has been elevated as well. Requests handicap placard. Reviewed elevated cholesterol not currently taking medication.  No past medical history on file. Past Surgical History  Procedure Date  . Anal fissure repair   . Left leg surgery 1964  . Colonoscopy     reports that he quit smoking about 31 years ago. He has never used smokeless tobacco. He reports that he drinks alcohol. He reports that he does not use illicit drugs. family history includes Diabetes in his brother.  There is no history of Prostate cancer, and Hypertension, and Breast cancer, and Colon cancer, and Heart disease, . Allergies  Allergen Reactions  . Codeine Rash    With aspirin  . Cortizone-5 (Hydrocortisone Base) Rash      Review of Systems see hpi     Objective:   Physical Exam  Nursing note and vitals reviewed. Constitutional: He appears well-developed and well-nourished. No distress.  HENT:  Head: Normocephalic and atraumatic.  Eyes: Conjunctivae normal are normal. No scleral icterus.  Cardiovascular: Normal rate, regular rhythm and normal heart sounds.   Musculoskeletal:       Bilateral lower extremity strength 5/5. Gait normal.  Neurological: He is alert.  Skin: He is not diaphoretic.  Psychiatric: He has a normal mood and affect.           Assessment & Plan:

## 2012-05-02 NOTE — Assessment & Plan Note (Signed)
Obtain fasting lipid profile and liver function tests. 

## 2012-06-01 ENCOUNTER — Ambulatory Visit: Payer: Medicare Other | Admitting: Internal Medicine

## 2013-05-02 ENCOUNTER — Emergency Department (HOSPITAL_BASED_OUTPATIENT_CLINIC_OR_DEPARTMENT_OTHER)
Admission: EM | Admit: 2013-05-02 | Discharge: 2013-05-02 | Disposition: A | Discharge status: Home / Self Care | Payer: Medicare Other | Attending: Emergency Medicine | Admitting: Emergency Medicine

## 2013-05-02 ENCOUNTER — Encounter (HOSPITAL_BASED_OUTPATIENT_CLINIC_OR_DEPARTMENT_OTHER): Payer: Self-pay | Admitting: Emergency Medicine

## 2013-05-02 DIAGNOSIS — G5600 Carpal tunnel syndrome, unspecified upper limb: Secondary | ICD-10-CM | POA: Insufficient documentation

## 2013-05-02 DIAGNOSIS — G5601 Carpal tunnel syndrome, right upper limb: Secondary | ICD-10-CM

## 2013-05-02 DIAGNOSIS — Z87891 Personal history of nicotine dependence: Secondary | ICD-10-CM | POA: Insufficient documentation

## 2013-05-02 NOTE — ED Provider Notes (Signed)
CSN: [id number]     Arrival date & time 05/02/13  0825 History   First MD Initiated Contact with Patient 05/02/13 0848     Chief Complaint  Patient presents with  . Hand Injury   (Consider location/radiation/quality/duration/timing/severity/associated sxs/prior Treatment) HPI Comments: 72 year old male with a few weeks of intermittent tingling and paresthesias to right hand. 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 He does not have any weakness or numbness. He never had symptoms similar to this before. Not tried any medications. Is not had any injuries. The symptoms sometimes radiate from his elbow.   History reviewed. No pertinent past medical history. Past Surgical History  Procedure Laterality Date  . Anal fissure repair    . Left leg surgery  1964  . Colonoscopy     Family History  Problem Relation Age of Onset  . Prostate cancer Neg Hx   . Diabetes Brother   . Hypertension Neg Hx   . Breast cancer Neg Hx   . Colon cancer Neg Hx   . Heart disease Neg Hx    History  Substance Use Topics  . Smoking status: Former Smoker    Quit date: 03/12/1981  . Smokeless tobacco: Never Used  . Alcohol Use: Yes     Comment: occasional    Review of Systems  Constitutional: Negative for fever and chills.  Gastrointestinal: Negative for vomiting.  Musculoskeletal: Negative for joint swelling and neck pain.  Skin: Negative for color change and wound.  Neurological: Negative for weakness and numbness.  All other systems reviewed and are negative.    Allergies  Codeine and Cortizone-5  Home Medications   Current Outpatient Rx  Name  Route  Sig  Dispense  Refill  . EXPIRED: amLODipine (NORVASC) 10 MG tablet   Oral   Take 1 tablet (10 mg total) by mouth daily.   30 tablet   11   . losartan (COZAAR) 50 MG tablet   Oral   Take 1 tablet (50 mg total) by mouth daily.   30 tablet   6   . meloxicam  (MOBIC) 7.5 MG tablet   Oral   Take 1 tablet (7.5 mg total) by mouth daily.   10 tablet   0   . methylPREDNISolone (MEDROL, PAK,) 4 MG tablet      follow package directions   21 tablet   0    BP 181/81  Pulse 67  Temp(Src) 98.1 F (36.7 C) (Oral)  Resp 14  Ht 5\' 9"  (1.753 m)  Wt 185 lb (83.915 kg)  BMI 27.31 kg/m2  SpO2 100% Physical Exam  Nursing note and vitals reviewed. Constitutional: He is oriented to person, place, and time. He appears well-developed and well-nourished.  HENT:  Head: Normocephalic and atraumatic.  Right Ear: External ear normal.  Left Ear: External ear normal.  Nose: Nose normal.  Eyes: Right eye exhibits no discharge. Left eye exhibits no discharge.  Neck: Neck supple.  Cardiovascular:  Pulses:      Radial pulses are 2+ on the right side, and 2+ on the left side.  Pulmonary/Chest: Effort normal.  Abdominal: He exhibits no distension.  Musculoskeletal: He exhibits no edema.       Hands: Neurological: He is alert and oriented to person, place, and time.  Skin: Skin is warm and dry.    ED Course  Procedures (including critical care time) Labs Review Labs Reviewed -  No data to display Imaging Review No results found.   MDM   1. Carpal tunnel syndrome, right    Patient symptoms are unchanged with phalen and Tinel tests. I refer, symptoms are most consistent with carpal tunnel. He points to the thumb side of his hand as the location of this appears seizures. There is no obvious swelling noted. There is no injury to warrant x-ray. We'll treat conservatively with NSAIDs and wrist splint. We'll have him followup with her PCP and if continued symptoms may need a Hydrographic surveyor.    Audree Camel, MD 05/02/13 [phone]

## 2013-05-02 NOTE — ED Notes (Signed)
Right hand pain for several days

## 2013-10-25 ENCOUNTER — Ambulatory Visit (INDEPENDENT_AMBULATORY_CARE_PROVIDER_SITE_OTHER): Payer: Medicare Other | Admitting: Physician Assistant

## 2013-10-25 ENCOUNTER — Encounter: Payer: Self-pay | Admitting: Physician Assistant

## 2013-10-25 ENCOUNTER — Telehealth: Payer: Self-pay | Admitting: Physician Assistant

## 2013-10-25 VITALS — BP 198/98 | HR 72 | Temp 98.6°F | Resp 16 | Ht 70.0 in | Wt 179.0 lb

## 2013-10-25 DIAGNOSIS — H538 Other visual disturbances: Secondary | ICD-10-CM

## 2013-10-25 DIAGNOSIS — I4510 Unspecified right bundle-branch block: Secondary | ICD-10-CM

## 2013-10-25 DIAGNOSIS — R42 Dizziness and giddiness: Secondary | ICD-10-CM

## 2013-10-25 DIAGNOSIS — I10 Essential (primary) hypertension: Secondary | ICD-10-CM

## 2013-10-25 MED ORDER — HYDROCHLOROTHIAZIDE 12.5 MG PO TABS: 12.5000 mg | ORAL_TABLET | Freq: Every day | ORAL | Status: DC

## 2013-10-25 MED ORDER — AMLODIPINE BESYLATE 10 MG PO TABS: 10.0000 mg | ORAL_TABLET | Freq: Every day | ORAL | Status: DC

## 2013-10-25 MED ORDER — HYDROCHLOROTHIAZIDE 25 MG PO TABS: 12.5000 mg | ORAL_TABLET | Freq: Every day | ORAL | Status: DC

## 2013-10-25 NOTE — Telephone Encounter (Signed)
Patient walked in the office stating that [facility] says that both medications will be over $200. Patient states that he cannot afford this and is wanting to know if we have a discount card for him or another medication to give? Best # [phone]

## 2013-10-25 NOTE — Telephone Encounter (Signed)
Phoned pharmacy to get clarification on medication cost: pt has no Rx Insurance Amlodipine sent for 90-day, cost $157.00; 30-day would be $52.50 HCTZ 12.5 mg sent, cost $30.49; 25 mg would be cheaper [facility]; Ok to resend with costwise 30-day Amlodipine & HCTZ 25 mg; Rx to pharmacy & pt informed/SLS

## 2013-10-25 NOTE — Patient Instructions (Signed)
Please take medications as directed.  There are two blood pressure medications -- Amlodipine and HCTZ.  REturn to clinic in 2 weeks.  I am referring you to cardiology for further assessment of EKG findings.  If you develop chest pain or shortness of breath, please call 911.  Hypertension As your heart beats, it forces blood through your arteries. This force is your blood pressure. If the pressure is too high, it is called hypertension (HTN) or high blood pressure. HTN is dangerous because you may have it and not know it. High blood pressure may mean that your heart has to work harder to pump blood. Your arteries may be narrow or stiff. The extra work puts you at risk for heart disease, stroke, and other problems.  Blood pressure consists of two numbers, a higher number over a lower, 110/72, for example. It is stated as "110 over 72." The ideal is below 120 for the top number (systolic) and under 80 for the bottom (diastolic). Write down your blood pressure today. You should pay close attention to your blood pressure if you have certain conditions such as:  Heart failure.  Prior heart attack.  Diabetes  Chronic kidney disease.  Prior stroke.  Multiple risk factors for heart disease. To see if you have HTN, your blood pressure should be measured while you are seated with your arm held at the level of the heart. It should be measured at least twice. A one-time elevated blood pressure reading (especially in the Emergency Department) does not mean that you need treatment. There may be conditions in which the blood pressure is different between your right and left arms. It is important to see your caregiver soon for a recheck. Most people have essential hypertension which means that there is not a specific cause. This type of high blood pressure may be lowered by changing lifestyle factors such as:  Stress.  Smoking.  Lack of exercise.  Excessive weight.  Drug/tobacco/alcohol use.  Eating  less salt. Most people do not have symptoms from high blood pressure until it has caused damage to the body. Effective treatment can often prevent, delay or reduce that damage. TREATMENT  When a cause has been identified, treatment for high blood pressure is directed at the cause. There are a large number of medications to treat HTN. These fall into several categories, and your caregiver will help you select the medicines that are best for you. Medications may have side effects. You should review side effects with your caregiver. If your blood pressure stays high after you have made lifestyle changes or started on medicines,   Your medication(s) may need to be changed.  Other problems may need to be addressed.  Be certain you understand your prescriptions, and know how and when to take your medicine.  Be sure to follow up with your caregiver within the time frame advised (usually within two weeks) to have your blood pressure rechecked and to review your medications.  If you are taking more than one medicine to lower your blood pressure, make sure you know how and at what times they should be taken. Taking two medicines at the same time can result in blood pressure that is too low. SEEK IMMEDIATE MEDICAL CARE IF:  You develop a severe headache, blurred or changing vision, or confusion.  You have unusual weakness or numbness, or a faint feeling.  You have severe chest or abdominal pain, vomiting, or breathing problems. MAKE SURE YOU:   Understand these instructions.  Will watch your condition.  Will get help right away if you are not doing well or get worse. Document Released: 07/06/2005 Document Revised: 09/28/2011 Document Reviewed: 02/24/2008 [facility] Patient Information 2014 [place].

## 2013-10-25 NOTE — Progress Notes (Signed)
Patient presents to clinic today c/o elevated BP.  Patient has not been seen in this clinic for 2.5-3 years and will need to return to formally re-establish care.  Patient has history of HTN, noted he felt slightly lightheaded and had his RN daughter check his BP.  BP was very high.  Daughter encouraged patient to go to the ER but patient states he refused.  Patient denies chest pain, SOB, palpitations, frequent headaches, nosebleeds.  Denies lightheadedness at present.  BP significantly elevated at 212/98 in clinic.  Patient previously on Amlodipine 10 mg.  Has not had medication in 2 years. Denies hx of CAD, MI or CVA.    No past medical history on file.  No current outpatient prescriptions on file prior to visit.   No current facility-administered medications on file prior to visit.    Allergies  Allergen Reactions  . Codeine Rash    With aspirin  . Cortizone-5 [Hydrocortisone Base] Rash    Family History  Problem Relation Age of Onset  . Prostate cancer Neg Hx   . Diabetes Brother   . Hypertension Neg Hx   . Breast cancer Neg Hx   . Colon cancer Neg Hx   . Heart disease Neg Hx     History   Social History  . Marital Status: Single    Spouse Name: N/A    Number of Children: N/A  . Years of Education: N/A   Social History Main Topics  . Smoking status: Former Smoker    Quit date: 03/12/1981  . Smokeless tobacco: Never Used  . Alcohol Use: Yes     Comment: occasional  . Drug Use: No  . Sexual Activity: None   Other Topics Concern  . None   Social History Narrative  . None   Review of Systems - See HPI.  All other ROS are negative.  BP 198/98  Pulse 72  Temp(Src) 98.6 F (37 C) (Oral)  Resp 16  Ht 5\' 10"  (1.778 m)  Wt 179 lb (81.194 kg)  BMI 25.68 kg/m2  SpO2 98%  Physical Exam  Vitals reviewed. Constitutional: He is oriented to person, place, and time and well-developed, well-nourished, and in no distress.  HENT:  Head: Normocephalic and atraumatic.   Right Ear: External ear normal.  Left Ear: External ear normal.  Nose: Nose normal.  Mouth/Throat: Oropharynx is clear and moist. No oropharyngeal exudate.  Eyes: Conjunctivae are normal. Pupils are equal, round, and reactive to light.  Neck: Normal range of motion. Neck supple. No thyromegaly present.  Cardiovascular: Normal rate, regular rhythm, normal heart sounds and intact distal pulses.   No murmur heard. Pulmonary/Chest: Effort normal and breath sounds normal. No respiratory distress. He has no wheezes. He has no rales. He exhibits no tenderness.  Neurological: He is alert and oriented to person, place, and time. No cranial nerve deficit.  Skin: Skin is warm and dry. No rash noted.  Psychiatric: Affect normal.   Diagnostics: EKG -- NSR with rate of 63 bpm; evidence of RBBB that was not present on previous EKG obtained in 2013.  Assessment/Plan: Unspecified essential hypertension EKG with NSR but newly diagnosed RBBB. Restart Amlodipine 10 mg.  Begin HCTX 12.5 mg daily.  Monitor BP daily.  Records BP measurements.  DASH handout given.  Return in 2 weeks for follow-up.  Patient instructed to take medication before appointment.  Patient educated to call 911 or proceed to ER if he develops chest pain, palpitations, SOB, LH or dizziness.  Patient voices understanding.  RBBB Newly diagnosed via EKG.  Patient Asymptomatic.  Referral placed to Cardiology for  Further evaluation.

## 2013-10-26 ENCOUNTER — Encounter: Payer: Self-pay | Admitting: Physician Assistant

## 2013-10-26 DIAGNOSIS — I4510 Unspecified right bundle-branch block: Secondary | ICD-10-CM | POA: Insufficient documentation

## 2013-10-26 NOTE — Assessment & Plan Note (Signed)
EKG with NSR but newly diagnosed RBBB. Restart Amlodipine 10 mg.  Begin HCTX 12.5 mg daily.  Monitor BP daily.  Records BP measurements.  DASH handout given.  Return in 2 weeks for follow-up.  Patient instructed to take medication before appointment.  Patient educated to call 911 or proceed to ER if he develops chest pain, palpitations, SOB, LH or dizziness.  Patient voices understanding.

## 2013-10-26 NOTE — Assessment & Plan Note (Signed)
Newly diagnosed via EKG.  Patient Asymptomatic.  Referral placed to Cardiology for  Further evaluation.

## 2013-10-27 ENCOUNTER — Encounter (HOSPITAL_BASED_OUTPATIENT_CLINIC_OR_DEPARTMENT_OTHER): Payer: Self-pay | Admitting: Emergency Medicine

## 2013-10-27 ENCOUNTER — Emergency Department (HOSPITAL_BASED_OUTPATIENT_CLINIC_OR_DEPARTMENT_OTHER)
Admission: EM | Admit: 2013-10-27 | Discharge: 2013-10-27 | Disposition: A | Discharge status: Home / Self Care | Payer: Medicare Other | Attending: Emergency Medicine | Admitting: Emergency Medicine

## 2013-10-27 DIAGNOSIS — R55 Syncope and collapse: Secondary | ICD-10-CM | POA: Insufficient documentation

## 2013-10-27 DIAGNOSIS — Z79899 Other long term (current) drug therapy: Secondary | ICD-10-CM | POA: Insufficient documentation

## 2013-10-27 DIAGNOSIS — Z87891 Personal history of nicotine dependence: Secondary | ICD-10-CM | POA: Insufficient documentation

## 2013-10-27 DIAGNOSIS — I10 Essential (primary) hypertension: Secondary | ICD-10-CM

## 2013-10-27 HISTORY — DX: Essential (primary) hypertension: I10

## 2013-10-27 LAB — COMPREHENSIVE METABOLIC PANEL
ALBUMIN: 4.6 g/dL (ref 3.5–5.2)
ALT: 14 U/L (ref 0–53)
AST: 20 U/L (ref 0–37)
Alkaline Phosphatase: 67 U/L (ref 39–117)
BILIRUBIN TOTAL: 0.4 mg/dL (ref 0.3–1.2)
BUN: 24 mg/dL — ABNORMAL HIGH (ref 6–23)
CHLORIDE: 99 meq/L (ref 96–112)
CO2: 27 mEq/L (ref 19–32)
Calcium: 10.1 mg/dL (ref 8.4–10.5)
Creatinine, Ser: 1.5 mg/dL — ABNORMAL HIGH (ref 0.50–1.35)
GFR calc Af Amer: 52 mL/min — ABNORMAL LOW (ref >90)
GFR calc non Af Amer: 45 mL/min — ABNORMAL LOW (ref >90)
GLUCOSE: 103 mg/dL — AB (ref 70–99)
Potassium: 4.8 mEq/L (ref 3.7–5.3)
SODIUM: 139 meq/L (ref 137–147)
TOTAL PROTEIN: 8.1 g/dL (ref 6.0–8.3)

## 2013-10-27 LAB — CBC WITH DIFFERENTIAL/PLATELET
Basophils Absolute: 0 10*3/uL (ref 0.0–0.1)
Basophils Relative: 0 % (ref 0–1)
EOS ABS: 0.1 10*3/uL (ref 0.0–0.7)
Eosinophils Relative: 1 % (ref 0–5)
HCT: 42.8 % (ref 39.0–52.0)
HEMOGLOBIN: 14.3 g/dL (ref 13.0–17.0)
LYMPHS PCT: 13 % (ref 12–46)
Lymphs Abs: 1.1 10*3/uL (ref 0.7–4.0)
MCH: 24.3 pg — AB (ref 26.0–34.0)
MCHC: 33.4 g/dL (ref 30.0–36.0)
MCV: 72.7 fL — ABNORMAL LOW (ref 78.0–100.0)
Monocytes Absolute: 0.8 10*3/uL (ref 0.1–1.0)
Monocytes Relative: 10 % (ref 3–12)
NEUTROS PCT: 76 % (ref 43–77)
Neutro Abs: 6.5 10*3/uL (ref 1.7–7.7)
PLATELETS: 276 10*3/uL (ref 150–400)
RBC: 5.89 MIL/uL — ABNORMAL HIGH (ref 4.22–5.81)
RDW: 17.3 % — ABNORMAL HIGH (ref 11.5–15.5)
WBC: 8.5 10*3/uL (ref 4.0–10.5)

## 2013-10-27 LAB — TROPONIN I: Troponin I: <0.3 ng/mL (ref <0.30)

## 2013-10-27 MED ORDER — SODIUM CHLORIDE 0.9 % IV SOLN: 1000.0000 mL | INTRAVENOUS | Status: DC

## 2013-10-27 MED ORDER — SODIUM CHLORIDE 0.9 % IV SOLN
1000.0000 mL | Freq: Once | INTRAVENOUS | Status: AC
  Administered 2013-10-27: 1000 mL via INTRAVENOUS

## 2013-10-27 NOTE — ED Notes (Signed)
Pt.  Reports he was just started on B/P med and today felt he was dizzy and light headed.  Pt. Was seen at [facility]D office on 10-25-13 and started on new b/p med.

## 2013-10-27 NOTE — Discharge Instructions (Signed)
Hypertension Hypertension is another name for high blood pressure. High blood pressure may mean that your heart needs to work harder to pump blood. Blood pressure consists of two numbers, which includes a higher number over a lower number (example: 110/72). HOME CARE   Make lifestyle changes as told by your doctor. This may include weight loss and exercise.  Take your blood pressure medicine every day.  Limit how much salt you use.  Stop smoking if you smoke.  Do not use drugs.  Talk to your doctor if you are using decongestants or birth control pills. These medicines might make blood pressure higher.  Females should not drink more than 1 alcoholic drink per day. Males should not drink more than 2 alcoholic drinks per day.  See your doctor as told. GET HELP RIGHT AWAY IF:   You have a blood pressure reading with a top number of 180 or higher.  You get a very bad headache.  You get blurred or changing vision.  You feel confused.  You feel weak, numb, or faint.  You get chest or belly (abdominal) pain.  You throw up (vomit).  You cannot breathe very well. MAKE SURE YOU:   Understand these instructions.  Will watch your condition.  Will get help right away if you are not doing well or get worse. Document Released: 12/23/2007 Document Revised: 09/28/2011 Document Reviewed: 12/23/2007 [facility] Patient Information 2014 [place].  Near-Syncope Near-syncope (commonly known as near fainting) is sudden weakness, dizziness, or feeling like you might pass out. This can happen when getting up or while standing for a long time. It is caused by a sudden decrease in blood flow to the brain, which can occur for various reasons. Most of the reasons are not serious.  HOME CARE Watch your condition for any changes.  Have someone stay with you until you feel stable.  If you feel like you are going to pass out:  Lie down right away.  Breathe deeply and steadily.  Move only  when the feeling has gone away. Most of the time, this feeling lasts only a few minutes. You may feel tired for several hours.  Drink enough fluids to keep your pee (urine) clear or pale yellow.  If you are taking blood pressure or heart medicine, stand up slowly.  Follow up with your doctor as told. GET HELP RIGHT AWAY IF:   You have a severe headache.  You have unusual pain in the chest, belly (abdomen), or back.  You have bleeding from the mouth or butt (rectum), or you have black or tarry poop (stool).  You feel your heart beat differently than normal, or you have a very fast pulse.  You pass out, or you twitch and shake when you pass out.  You pass out when sitting or lying down.  You feel confused.  You have trouble walking.  You are weak.  You have vision problems. MAKE SURE YOU:   Understand these instructions.  Will watch your condition.  Will get help right away if you are not doing well or get worse. Document Released: 12/23/2007 Document Revised: 03/08/2013 Document Reviewed: 12/09/2012 [facility] Patient Information 2014 [place], [place].

## 2013-10-27 NOTE — ED Provider Notes (Signed)
CSN: [id number]     Arrival date & time 10/27/13  1409 History   First MD Initiated Contact with Patient 10/27/13 1515     Chief Complaint  Patient presents with  . Near Syncope    Patient is a 73 y.o. male presenting with near-syncope.  Near Syncope   Pt started two days ago on a blood pressure medication.   He had it checked when he was travelling and his blood pressure was in the 200s.  He has not felt well since that time.  He had not been on any medications for a couple of years.  Today he started to feel lightheaded.  He then broke out into a sweat and started to feel weak.  He then came down to the ED.  The symptoms have now resolved.  He felt like that for 20 minutes.  No chest pain.  No abdominal pain.  No vomiting or diarrhea.  No blood in stool.  No dark colored stool.  He has had some intermittent nausea.     Past Medical History  Diagnosis Date  . Hypertension    Past Surgical History  Procedure Laterality Date  . Anal fissure repair    . Left leg surgery  1964  . Colonoscopy     Family History  Problem Relation Age of Onset  . Prostate cancer Neg Hx   . Diabetes Brother   . Hypertension Neg Hx   . Breast cancer Neg Hx   . Colon cancer Neg Hx   . Heart disease Neg Hx    History  Substance Use Topics  . Smoking status: Former Smoker    Quit date: 03/12/1981  . Smokeless tobacco: Never Used  . Alcohol Use: Yes     Comment: occasional    Review of Systems  Cardiovascular: Positive for near-syncope.      Allergies  Codeine and Cortizone-5  Home Medications   Current Outpatient Rx  Name  Route  Sig  Dispense  Refill  . amLODipine (NORVASC) 10 MG tablet   Oral   Take 1 tablet (10 mg total) by mouth daily.   30 tablet   3   . hydrochlorothiazide (HYDRODIURIL) 25 MG tablet   Oral   Take 0.5 tablets (12.5 mg total) by mouth daily.   30 tablet   3    BP 153/66  Pulse 60  Temp(Src) 98.7 F (37.1 C) (Oral)  Resp 14  Ht 5\' 10"  (1.778 m)  Wt  179 lb (81.194 kg)  BMI 25.68 kg/m2  SpO2 99% Physical Exam  Nursing note and vitals reviewed. Constitutional: He is oriented to person, place, and time. He appears well-developed and well-nourished. No distress.  HENT:  Head: Normocephalic and atraumatic.  Right Ear: External ear normal.  Left Ear: External ear normal.  Mouth/Throat: Oropharynx is clear and moist.  Eyes: Conjunctivae are normal. Right eye exhibits no discharge. Left eye exhibits no discharge. No scleral icterus.  Neck: Neck supple. No tracheal deviation present.  Cardiovascular: Normal rate, regular rhythm and intact distal pulses.   Pulmonary/Chest: Effort normal and breath sounds normal. No stridor. No respiratory distress. He has no wheezes. He has no rales.  Abdominal: Soft. Bowel sounds are normal. He exhibits no distension. There is no tenderness. There is no rebound and no guarding.  Musculoskeletal: He exhibits no edema and no tenderness.  Neurological: He is alert and oriented to person, place, and time. He has normal strength. No cranial nerve deficit (No facial droop,  extraocular movements intact, tongue midline ) or sensory deficit. He exhibits normal muscle tone. He displays no seizure activity. Coordination normal.  No pronator drift bilateral upper extrem, able to hold both legs off bed for 5 seconds, sensation intact in all extremities, no visual field cuts, no left or right sided neglect, normal finger-nose exam bilaterally, no nystagmus noted   Skin: Skin is warm and dry. No rash noted.  Psychiatric: He has a normal mood and affect.    ED Course  Procedures (including critical care time) Labs Review Labs Reviewed  CBC WITH DIFFERENTIAL - Abnormal; Notable for the following:    RBC 5.89 (*)    MCV 72.7 (*)    MCH 24.3 (*)    RDW 17.3 (*)    All other components within normal limits  COMPREHENSIVE METABOLIC PANEL - Abnormal; Notable for the following:    Glucose, Bld 103 (*)    BUN 24 (*)     Creatinine, Ser 1.50 (*)    GFR calc non Af Amer 45 (*)    GFR calc Af Amer 52 (*)    All other components within normal limits  TROPONIN I   Imaging Review No results found.   EKG Interpretation   Date/Time:  Friday October 27 2013 14:16:22 EDT Ventricular Rate:  63 PR Interval:  174 QRS Duration: 132 QT Interval:  424 QTC Calculation: 433 R Axis:   -28 Text Interpretation:  Sinus rhythm with Premature supraventricular  complexes Right bundle branch block Minimal voltage criteria for LVH, may  be normal variant Abnormal ECG No old tracing to compare in M[facility] but EKG  done at PCP office 2 daysa go looks similar Confirmed by [facility]  MD,  Leonette Most [id number]) on 10/27/2013 2:34:44 PM      MDM   Final diagnoses:  Near syncope  HTN (hypertension)    The patient was monitored in the emergency department. He had no evidence of cardiac dysrhythmia. I think the patient's may be related to recently starting his blood pressure medications. His blood pressure was 212/98 on April 8. When he first checked in today was 131/63. Patient had not been taking any blood pressure medications for approximately 2 years. He decided to stop these medications on his own. The patient cut down his Norvasc to 5 mg for the first week then resume back to 10 mg. Discussed the findings with him and recommended close followup with his primary care Dr.  He has a normal neurologic exam. I doubt stroke, TIA, or any other acute emergency medical condition.  Warning signs and precautions were discussed.    Celene Kras, MD 10/27/13 [phone]

## 2013-10-31 ENCOUNTER — Emergency Department (HOSPITAL_BASED_OUTPATIENT_CLINIC_OR_DEPARTMENT_OTHER)
Admission: EM | Admit: 2013-10-31 | Discharge: 2013-10-31 | Disposition: A | Discharge status: Home / Self Care | Payer: Medicare Other | Attending: Emergency Medicine | Admitting: Emergency Medicine

## 2013-10-31 ENCOUNTER — Encounter (HOSPITAL_BASED_OUTPATIENT_CLINIC_OR_DEPARTMENT_OTHER): Payer: Self-pay | Admitting: Emergency Medicine

## 2013-10-31 DIAGNOSIS — R232 Flushing: Secondary | ICD-10-CM | POA: Insufficient documentation

## 2013-10-31 DIAGNOSIS — I10 Essential (primary) hypertension: Secondary | ICD-10-CM | POA: Insufficient documentation

## 2013-10-31 DIAGNOSIS — Z87891 Personal history of nicotine dependence: Secondary | ICD-10-CM | POA: Insufficient documentation

## 2013-10-31 DIAGNOSIS — Z79899 Other long term (current) drug therapy: Secondary | ICD-10-CM | POA: Insufficient documentation

## 2013-10-31 MED ORDER — LOSARTAN POTASSIUM 25 MG PO TABS: 25.0000 mg | ORAL_TABLET | Freq: Every day | ORAL | Status: DC

## 2013-10-31 NOTE — ED Notes (Signed)
Pt reports that his BP medicine that he started on 2 days ago is causing him to have hot flashes.

## 2013-10-31 NOTE — ED Provider Notes (Signed)
CSN: [id number]     Arrival date & time 10/31/13  1119 History   First MD Initiated Contact with Patient 10/31/13 1230     Chief Complaint  Patient presents with  . Hypertension     (Consider location/radiation/quality/duration/timing/severity/associated sxs/prior Treatment) HPI Comments: 73 y.o. male history of hypertension who presents with complaint of hot flashes. He states that he was recently started on HCTZ and amlodipine on 10/25/2013. He has been taking the amlodipine in the evening and having hot flashes after taking it. He denies any chest pain, shortness of breath, dizziness, headaches, or other symptoms. He is currently asymptomatic. His symptoms are intermittent. He rates them as mild to moderate in severity. The hot flashes last approximately 10-20 minutes. Nothing has relieved his symptoms. He has not had similar symptoms in the past.   The history is provided by the patient and the spouse.    Past Medical History  Diagnosis Date  . Hypertension    Past Surgical History  Procedure Laterality Date  . Anal fissure repair    . Left leg surgery  1964  . Colonoscopy     Family History  Problem Relation Age of Onset  . Prostate cancer Neg Hx   . Diabetes Brother   . Hypertension Neg Hx   . Breast cancer Neg Hx   . Colon cancer Neg Hx   . Heart disease Neg Hx    History  Substance Use Topics  . Smoking status: Former Smoker    Quit date: 03/12/1981  . Smokeless tobacco: Never Used  . Alcohol Use: Yes     Comment: occasional    Review of Systems  Constitutional: Negative for fever.  HENT: Negative for drooling and rhinorrhea.   Eyes: Negative for pain.  Respiratory: Negative for cough and shortness of breath.   Cardiovascular: Negative for chest pain and leg swelling.  Gastrointestinal: Negative for nausea, vomiting, abdominal pain and diarrhea.  Genitourinary: Negative for dysuria and hematuria.  Musculoskeletal: Negative for gait problem and neck pain.   Skin: Negative for color change.  Neurological: Negative for numbness and headaches.  Hematological: Negative for adenopathy.  Psychiatric/Behavioral: Negative for behavioral problems.  All other systems reviewed and are negative.     Allergies  Codeine and Cortizone-5  Home Medications   Prior to Admission medications   Medication Sig Start Date End Date Taking? Authorizing Provider  amLODipine (NORVASC) 10 MG tablet Take 1 tablet (10 mg total) by mouth daily. 10/25/13   Piedad ClimesWilliam Cody Martin, PA-C  hydrochlorothiazide (HYDRODIURIL) 25 MG tablet Take 0.5 tablets (12.5 mg total) by mouth daily. 10/25/13   Piedad ClimesWilliam Cody Martin, PA-C   BP 171/71  Pulse 74  Temp(Src) 98.1 F (36.7 C) (Oral)  Resp 18  SpO2 99% Physical Exam  Nursing note and vitals reviewed. Constitutional: He is oriented to person, place, and time. He appears well-developed and well-nourished.  HENT:  Head: Normocephalic and atraumatic.  Right Ear: External ear normal.  Left Ear: External ear normal.  Nose: Nose normal.  Mouth/Throat: Oropharynx is clear and moist. No oropharyngeal exudate.  Eyes: Conjunctivae and EOM are normal. Pupils are equal, round, and reactive to light.  Neck: Normal range of motion. Neck supple.  Cardiovascular: Normal rate, regular rhythm, normal heart sounds and intact distal pulses.  Exam reveals no gallop and no friction rub.   No murmur heard. Pulmonary/Chest: Effort normal and breath sounds normal. No respiratory distress. He has no wheezes.  Abdominal: Soft. Bowel sounds are normal. He  exhibits no distension. There is no tenderness. There is no rebound and no guarding.  Musculoskeletal: Normal range of motion. He exhibits no edema and no tenderness.  Neurological: He is alert and oriented to person, place, and time.  Skin: Skin is warm and dry.  Psychiatric: He has a normal mood and affect. His behavior is normal.    ED Course  Procedures (including critical care time) Labs  Review Labs Reviewed - No data to display  Imaging Review No results found.   EKG Interpretation None      MDM   Final diagnoses:  HTN (hypertension)  Hot flashes    1:15 PM 73 y.o. male history of hypertension who presents with complaint of hot flashes. He states that he was recently started on HCTZ and amlodipine on 10/25/2013. He has been taking the amlodipine in the evening and having hot flashes after taking it. He denies any chest pain, shortness of breath, dizziness, headaches, or other symptoms. He is currently asymptomatic. He is afebrile and mildly hypertensive here. He appears well on exam. This is possibly a side effect of the amlodipine since it seems to happen after he takes it. In review of his records it looks like he was on losartan in the past. Will have him hold the Norvasc for now and will restart losartan. I preferred that he just hold the norvasc and f/u w/ his physician, but he would like a BP medicine in its place until he can f/u. Given his recent visit w/ elevated BP, I think this is reasonable. The patient is not sure why he was taken off losartan but states that he never had an adverse reaction to it. Will have him followup with his PCP for further instruction about his blood pressure medicine.   Will start a low dose of losartan given pt's hx of mild renal insuff.  I have discussed the diagnosis/risks/treatment options with the patient and family and believe the pt to be eligible for discharge home to follow-up with his pcp to discuss BP medicines. We also discussed returning to the ED immediately if new or worsening sx occur. We discussed the sx which are most concerning (e.g., worsening hot flashes, dizziness, syncope) that necessitate immediate return. Medications administered to the patient during their visit and any new prescriptions provided to the patient are listed below.  Medications given during this visit Medications - No data to display  Discharge  Medication List as of 10/31/2013  1:18 PM    START taking these medications   Details  losartan (COZAAR) 25 MG tablet Take 1 tablet (25 mg total) by mouth daily., Starting 10/31/2013, Until Discontinued, Print           Junius ArgyleForrest S Matea Stanard, MD 10/31/13 2124

## 2013-11-13 ENCOUNTER — Encounter (HOSPITAL_BASED_OUTPATIENT_CLINIC_OR_DEPARTMENT_OTHER): Payer: Self-pay | Admitting: Emergency Medicine

## 2013-11-13 ENCOUNTER — Emergency Department (HOSPITAL_BASED_OUTPATIENT_CLINIC_OR_DEPARTMENT_OTHER)
Admission: EM | Admit: 2013-11-13 | Discharge: 2013-11-13 | Disposition: A | Discharge status: Home / Self Care | Payer: Medicare Other | Attending: Emergency Medicine | Admitting: Emergency Medicine

## 2013-11-13 DIAGNOSIS — I10 Essential (primary) hypertension: Secondary | ICD-10-CM | POA: Insufficient documentation

## 2013-11-13 DIAGNOSIS — Z87891 Personal history of nicotine dependence: Secondary | ICD-10-CM | POA: Insufficient documentation

## 2013-11-13 DIAGNOSIS — Z79899 Other long term (current) drug therapy: Secondary | ICD-10-CM | POA: Insufficient documentation

## 2013-11-13 LAB — BASIC METABOLIC PANEL
BUN: 12 mg/dL (ref 6–23)
CALCIUM: 10 mg/dL (ref 8.4–10.5)
CHLORIDE: 104 meq/L (ref 96–112)
CO2: 30 meq/L (ref 19–32)
CREATININE: 1.2 mg/dL (ref 0.50–1.35)
GFR calc Af Amer: 68 mL/min — ABNORMAL LOW (ref >90)
GFR calc non Af Amer: 59 mL/min — ABNORMAL LOW (ref >90)
Glucose, Bld: 102 mg/dL — ABNORMAL HIGH (ref 70–99)
Potassium: 4.2 mEq/L (ref 3.7–5.3)
Sodium: 145 mEq/L (ref 137–147)

## 2013-11-13 MED ORDER — HYDROCHLOROTHIAZIDE 25 MG PO TABS: 12.5000 mg | ORAL_TABLET | Freq: Every day | ORAL | Status: DC

## 2013-11-13 MED ORDER — LOSARTAN POTASSIUM 25 MG PO TABS
25.0000 mg | ORAL_TABLET | Freq: Once | ORAL | Status: DC
  Filled 2013-11-13: qty 1

## 2013-11-13 NOTE — ED Notes (Signed)
Pt c/o feeling "lightheaded and offbalance" intermittent since yesterday morning. Pt sts he was able to play golf yesterday and felt fine but today feels like his BP is high and unsteady. Pt drove self to ED. Pt alert and oriented, nad noted, No facial asymmetry, grips equal.

## 2013-11-13 NOTE — ED Notes (Signed)
Medication ordered not in pyxis, MD aware and advised that pt may take his own med from home.

## 2013-11-13 NOTE — Discharge Instructions (Signed)
Please continue your losartan (Cozaar). Restart HCTZ (hydrochlothiazide) until your follow-up appointment next week.   Hypertension As your heart beats, it forces blood through your arteries. This force is your blood pressure. If the pressure is too high, it is called hypertension (HTN) or high blood pressure. HTN is dangerous because you may have it and not know it. High blood pressure may mean that your heart has to work harder to pump blood. Your arteries may be narrow or stiff. The extra work puts you at risk for heart disease, stroke, and other problems.  Blood pressure consists of two numbers, a higher number over a lower, 110/72, for example. It is stated as "110 over 72." The ideal is below 120 for the top number (systolic) and under 80 for the bottom (diastolic). Write down your blood pressure today. You should pay close attention to your blood pressure if you have certain conditions such as:  Heart failure.  Prior heart attack.  Diabetes  Chronic kidney disease.  Prior stroke.  Multiple risk factors for heart disease. To see if you have HTN, your blood pressure should be measured while you are seated with your arm held at the level of the heart. It should be measured at least twice. A one-time elevated blood pressure reading (especially in the Emergency Department) does not mean that you need treatment. There may be conditions in which the blood pressure is different between your right and left arms. It is important to see your caregiver soon for a recheck. Most people have essential hypertension which means that there is not a specific cause. This type of high blood pressure may be lowered by changing lifestyle factors such as:  Stress.  Smoking.  Lack of exercise.  Excessive weight.  Drug/tobacco/alcohol use.  Eating less salt. Most people do not have symptoms from high blood pressure until it has caused damage to the body. Effective treatment can often prevent, delay or  reduce that damage. TREATMENT  When a cause has been identified, treatment for high blood pressure is directed at the cause. There are a large number of medications to treat HTN. These fall into several categories, and your caregiver will help you select the medicines that are best for you. Medications may have side effects. You should review side effects with your caregiver. If your blood pressure stays high after you have made lifestyle changes or started on medicines,   Your medication(s) may need to be changed.  Other problems may need to be addressed.  Be certain you understand your prescriptions, and know how and when to take your medicine.  Be sure to follow up with your caregiver within the time frame advised (usually within two weeks) to have your blood pressure rechecked and to review your medications.  If you are taking more than one medicine to lower your blood pressure, make sure you know how and at what times they should be taken. Taking two medicines at the same time can result in blood pressure that is too low. SEEK IMMEDIATE MEDICAL CARE IF:  You develop a severe headache, blurred or changing vision, or confusion.  You have unusual weakness or numbness, or a faint feeling.  You have severe chest or abdominal pain, vomiting, or breathing problems. MAKE SURE YOU:   Understand these instructions.  Will watch your condition.  Will get help right away if you are not doing well or get worse. Document Released: 07/06/2005 Document Revised: 09/28/2011 Document Reviewed: 02/24/2008 [facility] Patient Information 2014 [place].

## 2013-11-13 NOTE — ED Provider Notes (Signed)
CSN: [id number]     Arrival date & time 11/13/13  0757 History   First MD Initiated Contact with Patient 11/13/13 [phone]     Chief Complaint  Patient presents with  . Dizziness     (Consider location/radiation/quality/duration/timing/severity/associated sxs/prior Treatment) Patient is a 73 y.o. male presenting with dizziness.  Dizziness  Pt with history of HTN has had several medication changes recently. He had actually been off medications for several years until 4/8 when he was seen at PCP office and started on Norvasc and HCTZ. He was seen in the ED a few days later for feeling lightheaded, given fluids and felt better. He was seen again in the ED 4 days later with 'hot flashes' after taking Norvasc. This medication was stopped and changed to Losartan. He apparently was also supposed to continue HCTZ  But he did not. He presents today complaining of dizziness, he feels is off balance. He played in a 3 day golf tournament over the last weekend. Denies any CP, SOB, N/V/D. Pt states he has not taken his medications this AM.    Past Medical History  Diagnosis Date  . Hypertension    Past Surgical History  Procedure Laterality Date  . Anal fissure repair    . Left leg surgery  1964  . Colonoscopy     Family History  Problem Relation Age of Onset  . Prostate cancer Neg Hx   . Diabetes Brother   . Hypertension Neg Hx   . Breast cancer Neg Hx   . Colon cancer Neg Hx   . Heart disease Neg Hx    History  Substance Use Topics  . Smoking status: Former Smoker    Quit date: 03/12/1981  . Smokeless tobacco: Never Used  . Alcohol Use: Yes     Comment: occasional    Review of Systems  Neurological: Positive for dizziness.   All other systems reviewed and are negative except as noted in HPI.     Allergies  Codeine and Cortizone-5  Home Medications   Prior to Admission medications   Medication Sig Start Date End Date Taking? Authorizing Provider  losartan (COZAAR) 25 MG  tablet Take 1 tablet (25 mg total) by mouth daily. 10/31/13  Yes Junius ArgyleForrest S Harrison, MD  amLODipine (NORVASC) 10 MG tablet Take 1 tablet (10 mg total) by mouth daily. 10/25/13   Piedad ClimesWilliam Cody Martin, PA-C  hydrochlorothiazide (HYDRODIURIL) 25 MG tablet Take 0.5 tablets (12.5 mg total) by mouth daily. 10/25/13   Piedad ClimesWilliam Cody Martin, PA-C   BP 209/92  Pulse 69  Temp(Src) 98.3 F (36.8 C) (Oral)  Resp 16  Ht 5\' 9"  (1.753 m)  Wt 179 lb (81.194 kg)  BMI 26.42 kg/m2  SpO2 100% Physical Exam  Nursing note and vitals reviewed. Constitutional: He is oriented to person, place, and time. He appears well-developed and well-nourished.  HENT:  Head: Normocephalic and atraumatic.  Eyes: EOM are normal. Pupils are equal, round, and reactive to light.  Neck: Normal range of motion. Neck supple.  Cardiovascular: Normal rate, normal heart sounds and intact distal pulses.   Pulmonary/Chest: Effort normal and breath sounds normal.  Abdominal: Bowel sounds are normal. He exhibits no distension. There is no tenderness.  Musculoskeletal: Normal range of motion. He exhibits no edema and no tenderness.  Neurological: He is alert and oriented to person, place, and time. He has normal strength. No cranial nerve deficit or sensory deficit. Coordination normal.  Skin: Skin is warm and dry. No rash noted.  Psychiatric: He has a normal mood and affect.    ED Course  Procedures (including critical care time) Labs Review Labs Reviewed  BASIC METABOLIC PANEL - Abnormal; Notable for the following:    Glucose, Bld 102 (*)    GFR calc non Af Amer 59 (*)    GFR calc Af Amer 68 (*)    All other components within normal limits    Imaging Review No results found.   EKG Interpretation   Date/Time:  Monday November 13 2013 08:18:09 EDT Ventricular Rate:  59 PR Interval:  168 QRS Duration: 128 QT Interval:  426 QTC Calculation: 421 R Axis:   -18 Text Interpretation:  Sinus bradycardia Right bundle branch block  Abnormal  ECG No significant change since last tracing Confirmed by SHELDON  MD,  Leonette MostHARLES [id number]) on 11/13/2013 8:24:04 AM      MDM   Final diagnoses:  HTN (hypertension)    Pt given his home Losartan dose. I explained to the patient that long term management of HTN would need to be done through his PCP office. Will check for evidence of end organ damage today.   10:25 AM BP improved with home Losartan. I believe he should still be taking HCTZ as well, but he states he threw it out. He has PCP followup already scheduled.   Demarrius B. Bernette MayersSheldon, MD 11/13/13 1026

## 2013-11-15 ENCOUNTER — Encounter: Payer: Self-pay | Admitting: Physician Assistant

## 2013-11-15 ENCOUNTER — Ambulatory Visit (INDEPENDENT_AMBULATORY_CARE_PROVIDER_SITE_OTHER): Payer: Medicare Other | Admitting: Physician Assistant

## 2013-11-15 ENCOUNTER — Telehealth: Payer: Self-pay | Admitting: Physician Assistant

## 2013-11-15 VITALS — BP 152/80 | HR 67 | Temp 97.5°F | Resp 16 | Ht 70.0 in | Wt 181.0 lb

## 2013-11-15 DIAGNOSIS — Z23 Encounter for immunization: Secondary | ICD-10-CM

## 2013-11-15 DIAGNOSIS — Z0000 Encounter for general adult medical examination without abnormal findings: Secondary | ICD-10-CM | POA: Insufficient documentation

## 2013-11-15 DIAGNOSIS — I10 Essential (primary) hypertension: Secondary | ICD-10-CM

## 2013-11-15 DIAGNOSIS — Z125 Encounter for screening for malignant neoplasm of prostate: Secondary | ICD-10-CM

## 2013-11-15 LAB — LIPID PANEL
Cholesterol: 215 mg/dL — ABNORMAL HIGH (ref 0–200)
HDL: 54 mg/dL (ref >39)
LDL CALC: 139 mg/dL — AB (ref 0–99)
TRIGLYCERIDES: 108 mg/dL (ref <150)
Total CHOL/HDL Ratio: 4 Ratio
VLDL: 22 mg/dL (ref 0–40)

## 2013-11-15 LAB — HEPATIC FUNCTION PANEL
ALT: 13 U/L (ref 0–53)
AST: 21 U/L (ref 0–37)
Albumin: 4.2 g/dL (ref 3.5–5.2)
Alkaline Phosphatase: 66 U/L (ref 39–117)
BILIRUBIN DIRECT: 0.1 mg/dL (ref 0.0–0.3)
BILIRUBIN TOTAL: 0.4 mg/dL (ref 0.2–1.2)
Indirect Bilirubin: 0.3 mg/dL (ref 0.2–1.2)
Total Protein: 6.8 g/dL (ref 6.0–8.3)

## 2013-11-15 LAB — BASIC METABOLIC PANEL
BUN: 22 mg/dL (ref 6–23)
CALCIUM: 9.5 mg/dL (ref 8.4–10.5)
CO2: 28 mEq/L (ref 19–32)
CREATININE: 1.26 mg/dL (ref 0.50–1.35)
Chloride: 101 mEq/L (ref 96–112)
Glucose, Bld: 73 mg/dL (ref 70–99)
Potassium: 4.8 mEq/L (ref 3.5–5.3)
Sodium: 137 mEq/L (ref 135–145)

## 2013-11-15 LAB — CBC WITH DIFFERENTIAL/PLATELET
BASOS ABS: 0 10*3/uL (ref 0.0–0.1)
Basophils Relative: 0 % (ref 0–1)
EOS ABS: 0.3 10*3/uL (ref 0.0–0.7)
EOS PCT: 5 % (ref 0–5)
HEMATOCRIT: 40.9 % (ref 39.0–52.0)
Hemoglobin: 13.6 g/dL (ref 13.0–17.0)
Lymphocytes Relative: 21 % (ref 12–46)
Lymphs Abs: 1.4 10*3/uL (ref 0.7–4.0)
MCH: 23.9 pg — ABNORMAL LOW (ref 26.0–34.0)
MCHC: 33.3 g/dL (ref 30.0–36.0)
MCV: 72 fL — AB (ref 78.0–100.0)
MONO ABS: 0.6 10*3/uL (ref 0.1–1.0)
Monocytes Relative: 9 % (ref 3–12)
Neutro Abs: 4.2 10*3/uL (ref 1.7–7.7)
Neutrophils Relative %: 65 % (ref 43–77)
Platelets: 286 10*3/uL (ref 150–400)
RBC: 5.68 MIL/uL (ref 4.22–5.81)
RDW: 16.9 % — AB (ref 11.5–15.5)
WBC: 6.5 10*3/uL (ref 4.0–10.5)

## 2013-11-15 LAB — TSH: TSH: 0.942 u[IU]/mL (ref 0.350–4.500)

## 2013-11-15 MED ORDER — LOSARTAN POTASSIUM 50 MG PO TABS: 25.0000 mg | ORAL_TABLET | Freq: Every day | ORAL | Status: DC

## 2013-11-15 NOTE — Assessment & Plan Note (Signed)
Giving orthostatic drop in SBP, will discontinue HCTZ as this is more likely culprit.  However, BP still markedly above goal.  Will increase Losartan to 50 mg.  Follow-up in 2 weeks.  Will likely have to increase dose and/or add additional antihypertensive agent.

## 2013-11-15 NOTE — Patient Instructions (Signed)
Please obtain labs.  I will call you with your results.  For Blood Pressure -- Please STOP the Hydrochlorothiazide.  Increase the Losartan to 50 mg day (That's two tablets of your current prescription)  When you pick up your refill, it will be for the 50 mg tablet.  You will then take one of these per day.  Return to clinic in 2 weeks for BP recheck and follow-up.  Read information below on the DASH diet.  IF you develop chest pain, shortness of breath or palpitations, please call 911 or proceed to the ER.  I think lightheadedness will improve now that we have stopped the HCTZ.  DASH Diet The DASH diet stands for "Dietary Approaches to Stop Hypertension." It is a healthy eating plan that has been shown to reduce high blood pressure (hypertension) in as little as 14 days, while also possibly providing other significant health benefits. These other health benefits include reducing the risk of breast cancer after menopause and reducing the risk of type 2 diabetes, heart disease, colon cancer, and stroke. Health benefits also include weight loss and slowing kidney failure in patients with chronic kidney disease.  DIET GUIDELINES  Limit salt (sodium). Your diet should contain less than 1500 mg of sodium daily.  Limit refined or processed carbohydrates. Your diet should include mostly whole grains. Desserts and added sugars should be used sparingly.  Include small amounts of heart-healthy fats. These types of fats include nuts, oils, and tub margarine. Limit saturated and trans fats. These fats have been shown to be harmful in the body. CHOOSING FOODS  The following food groups are based on a 2000 calorie diet. See your Registered Dietitian for individual calorie needs. Grains and Grain Products (6 to 8 servings daily)  Eat More Often: Whole-wheat bread, brown rice, whole-grain or wheat pasta, quinoa, popcorn without added fat or salt (air popped).  Eat Less Often: White bread, white pasta, white rice,  cornbread. Vegetables (4 to 5 servings daily)  Eat More Often: Fresh, frozen, and canned vegetables. Vegetables may be raw, steamed, roasted, or grilled with a minimal amount of fat.  Eat Less Often/Avoid: Creamed or fried vegetables. Vegetables in a cheese sauce. Fruit (4 to 5 servings daily)  Eat More Often: All fresh, canned (in natural juice), or frozen fruits. Dried fruits without added sugar. One hundred percent fruit juice ( cup [237 mL] daily).  Eat Less Often: Dried fruits with added sugar. Canned fruit in light or heavy syrup. Foot LockerLean Meats, Fish, and Poultry (2 servings or less daily. One serving is 3 to 4 oz [85-114 g]).  Eat More Often: Ninety percent or leaner ground beef, tenderloin, sirloin. Round cuts of beef, chicken breast, [place] breast. All fish. Grill, bake, or broil your meat. Nothing should be fried.  Eat Less Often/Avoid: Fatty cuts of meat, [place], or chicken leg, thigh, or wing. Fried cuts of meat or fish. Dairy (2 to 3 servings)  Eat More Often: Low-fat or fat-free milk, low-fat plain or light yogurt, reduced-fat or part-skim cheese.  Eat Less Often/Avoid: Milk (whole, 2%).Whole milk yogurt. Full-fat cheeses. Nuts, Seeds, and Legumes (4 to 5 servings per week)  Eat More Often: All without added salt.  Eat Less Often/Avoid: Salted nuts and seeds, canned beans with added salt. Fats and Sweets (limited)  Eat More Often: Vegetable oils, tub margarines without trans fats, sugar-free gelatin. Mayonnaise and salad dressings.  Eat Less Often/Avoid: Coconut oils, palm oils, butter, stick margarine, cream, half and half, cookies, candy,  pie. FOR MORE INFORMATION The Dash Diet Eating Plan: www.dashdiet.org Document Released: 06/25/2011 Document Revised: 09/28/2011 Document Reviewed: 06/25/2011 [facility] Patient Information 2014 [place], [place].

## 2013-11-15 NOTE — Progress Notes (Signed)
Subjective:   Patient here for Medicare annual wellness visit and management of other chronic and acute problems.  Hypertension: Patient has been seen 3 times in ER since his visit 2 weeks ago for significant BP elevations.  Amlodipine was discontinued due to reported side effect.  Patient was to continue HCTZ.  Losartan was started.  Patient states he still has occasional lightheadedness.  Endorses taking medication this AM. BP 182/84.  OVS + for 25-30 mm Hg drop in SBP.  Denies chest pain, SOB, headache, vision changes or syncope.  EKG obtained at last visit revealing NSR with new RBBB.  Referral was placed to Cardiology for further evaluation.  RBBB: Patient awaiting appointment with Cardiology  Health Maintenance: Dental -- Overdue Vision -- Overdue Colonoscopy -- 2011; no abnormal findings.  Due in 2021. PSA -- Overdue Immunizations -- Declines Tetanus.  Declines Pneumonia vaccine.  Risk factors: Hypertension  Roster of Physicians Providing Medical Care to Patient: Waldon MerlWilliam C. Rashunda Passon, PA-C -- Family Medicine Dr. Jens Somrenshaw -- Cardiology  Activities of Daily Living  In your present state of health, do you have any difficulty performing the following activities? Preparing food and eating?: No  Bathing yourself: No  Getting dressed: No  Using the toilet:No  Moving around from place to place: No  In the past year have you fallen or had a near fall?: No    Home Safety: Has smoke detector and wears seat belts. No firearms. No excess sun exposure.  Diet and Exercise  Current exercise habits: Play Golf 3-4 days per week.  Washes cars daily.  Walking.   Dietary issues discussed: healthy diet.  Depression Screen  (Note: if answer to either of the following is "Yes", then a more complete depression screening is indicated)  Q1: Over the past two weeks, have you felt down, depressed or hopeless? no  Q2: Over the past two weeks, have you felt little interest or pleasure in doing things? no    The following portions of the patient's history were reviewed and updated as appropriate: allergies, current medications, past family history, past medical history, past social history, past surgical history and problem list.   Review of Systems  Constitutional: Negative for fever and weight loss.  HENT: Negative for ear discharge, ear pain, hearing loss and tinnitus.   Eyes: Negative for blurred vision, double vision, photophobia and pain.  Respiratory: Negative for shortness of breath.   Cardiovascular: Negative for chest pain and palpitations.  Gastrointestinal: Negative for heartburn, nausea, vomiting, abdominal pain, diarrhea, constipation, blood in stool and melena.  Genitourinary: Negative for dysuria, urgency, frequency, hematuria and flank pain.       Nocturia x 1-2.  Neurological: Negative for dizziness, loss of consciousness and headaches.  Endo/Heme/Allergies: Negative for environmental allergies.  Psychiatric/Behavioral: Negative for depression, suicidal ideas, hallucinations and substance abuse. The patient is not nervous/anxious and does not have insomnia.    Objective:   Vision: L 20/30; R 20/30; B 20/25 Hearing: Patient able to hear a forced whisper from across the room. Body mass index: Body mass index is 25.97 kg/(m^2). Cognitive Impairment Assessment: cognition, memory and judgment appear normal.   Physical Exam  Assessment:   (1) Medicare wellness utd on preventive parameters (2) Hypertension  (3) RBBB  (4) Prostate Cancer Screening  Plan:   (1) During the course of the visit the patient was educated and counseled about appropriate screening and preventive services including: Fall prevention, Diabetes screening, Nutrition counseling. Patient declines pneumonia vaccination and tetanus vaccination. Will  obtain fasting labs to include PSA level for prostate cancer screening.  (2) Will stop HCTZ giving orthostatic hypotension and mild dehydration.  Will increase  Losartan to 50 mg daily.  Will follow-up in 2 weeks for BP recheck.  Will likely have to increase dosage further and/or add additional antihypertensive agent.  Unfortunately we are having to progress slowly due to patient's intolerance of medications.  (3) Appointment scheduled with Cardiology.  (4) Will obtain PSA.  Patient Instructions (the written plan) was given to the patient.

## 2013-11-15 NOTE — Progress Notes (Signed)
Pre visit review using our clinic review tool, if applicable. No additional management support is needed unless otherwise documented below in the visit note/SLS  

## 2013-11-15 NOTE — Assessment & Plan Note (Signed)
Will obtain PSA

## 2013-11-15 NOTE — Assessment & Plan Note (Signed)
History reviewed.  UTD on Colonoscopy. Patient declines Tetanus and Pneumonia shot.  Will obtain fasting labs.

## 2013-11-15 NOTE — Telephone Encounter (Signed)
Relevant patient education mailed to patient.  

## 2013-11-16 ENCOUNTER — Telehealth: Payer: Self-pay | Admitting: Physician Assistant

## 2013-11-16 DIAGNOSIS — R972 Elevated prostate specific antigen [PSA]: Secondary | ICD-10-CM

## 2013-11-16 LAB — URINALYSIS, ROUTINE W REFLEX MICROSCOPIC
Bilirubin Urine: NEGATIVE
Glucose, UA: NEGATIVE mg/dL
Hgb urine dipstick: NEGATIVE
Ketones, ur: NEGATIVE mg/dL
LEUKOCYTES UA: NEGATIVE
NITRITE: NEGATIVE
PH: 6 (ref 5.0–8.0)
Protein, ur: NEGATIVE mg/dL
SPECIFIC GRAVITY, URINE: 1.012 (ref 1.005–1.030)
Urobilinogen, UA: 0.2 mg/dL (ref 0.0–1.0)

## 2013-11-16 LAB — PSA: PSA: 13.35 ng/mL — ABNORMAL HIGH (ref <=4.00)

## 2013-11-16 NOTE — Telephone Encounter (Signed)
Lab results are in.  Look good overall.  However, LDL is mildly elevated.  Try to continue aerobic activity and monitor intake of foods high in cholesterol and saturated fats.  PSA level is very significantly elevated.  He needs to be seen by Urology.  I have placed a referral.  He will be contacted by Specialty for appointment.  It is very important he go to that appointment.

## 2013-11-17 NOTE — Addendum Note (Signed)
Addended by: Regis BillSCATES, Merrick Feutz L on: 11/17/2013 12:19 PM   Modules accepted: Orders

## 2013-11-17 NOTE — Telephone Encounter (Signed)
LMOM with contact name and number for return call RE: results and further provider instructions/SLS  

## 2013-11-23 NOTE — Telephone Encounter (Signed)
LMOM [2nd] with contact name and number for return call RE: results and further provider instructions;left inquiry as to if he had been contacted and/or had appt for Urology/SLS

## 2013-11-30 ENCOUNTER — Ambulatory Visit (INDEPENDENT_AMBULATORY_CARE_PROVIDER_SITE_OTHER): Payer: Medicare Other | Admitting: Physician Assistant

## 2013-11-30 ENCOUNTER — Encounter: Payer: Self-pay | Admitting: Physician Assistant

## 2013-11-30 VITALS — BP 152/90 | HR 68 | Temp 97.8°F | Resp 16 | Ht 70.0 in | Wt 177.5 lb

## 2013-11-30 DIAGNOSIS — I10 Essential (primary) hypertension: Secondary | ICD-10-CM

## 2013-11-30 MED ORDER — LOSARTAN POTASSIUM 50 MG PO TABS: 50.0000 mg | ORAL_TABLET | Freq: Every day | ORAL | Status: DC

## 2013-11-30 NOTE — Patient Instructions (Addendum)
Please continue medications as directed.  Continue monitoring blood pressure at home.  Continue diet and exercise.  It is important that you have taken your BP medication prior to visits so that we can get an accurate reading in clinic.  This helps us assess whether or not we need to change medication dosages.  Please go to your Urology appointment.  This is very important.  Hypertension As your heart beats, it forces blood through your arteries. This force is your blood pressure. If the pressure is too high, it is called hypertension (HTN) or high blood pressure. HTN is dangerous because you may have it and not know it. High blood pressure may mean that your heart has to work harder to pump blood. Your arteries may be narrow or stiff. The extra work puts you at risk for heart disease, stroke, and other problems.  Blood pressure consists of two numbers, a higher number over a lower, 110/72, for example. It is stated as "110 over 72." The ideal is below 120 for the top number (systolic) and under 80 for the bottom (diastolic). Write down your blood pressure today. You should pay close attention to your blood pressure if you have certain conditions such as:  Heart failure.  Prior heart attack.  Diabetes  Chronic kidney disease.  Prior stroke.  Multiple risk factors for heart disease. To see if you have HTN, your blood pressure should be measured while you are seated with your arm held at the level of the heart. It should be measured at least twice. A one-time elevated blood pressure reading (especially in the Emergency Department) does not mean that you need treatment. There may be conditions in which the blood pressure is different between your right and left arms. It is important to see your caregiver soon for a recheck. Most people have essential hypertension which means that there is not a specific cause. This type of high blood pressure may be lowered by changing lifestyle factors such  as:  Stress.  Smoking.  Lack of exercise.  Excessive weight.  Drug/tobacco/alcohol use.  Eating less salt. Most people do not have symptoms from high blood pressure until it has caused damage to the body. Effective treatment can often prevent, delay or reduce that damage. TREATMENT  When a cause has been identified, treatment for high blood pressure is directed at the cause. There are a large number of medications to treat HTN. These fall into several categories, and your caregiver will help you select the medicines that are best for you. Medications may have side effects. You should review side effects with your caregiver. If your blood pressure stays high after you have made lifestyle changes or started on medicines,   Your medication(s) may need to be changed.  Other problems may need to be addressed.  Be certain you understand your prescriptions, and know how and when to take your medicine.  Be sure to follow up with your caregiver within the time frame advised (usually within two weeks) to have your blood pressure rechecked and to review your medications.  If you are taking more than one medicine to lower your blood pressure, make sure you know how and at what times they should be taken. Taking two medicines at the same time can result in blood pressure that is too low. SEEK IMMEDIATE MEDICAL CARE IF:  You develop a severe headache, blurred or changing vision, or confusion.  You have unusual weakness or numbness, or a faint feeling.  You have  severe chest or abdominal pain, vomiting, or breathing problems. MAKE SURE YOU:   Understand these instructions.  Will watch your condition.  Will get help right away if you are not doing well or get worse. Document Released: 07/06/2005 Document Revised: 09/28/2011 Document Reviewed: 02/24/2008 [facility] Patient Information 2014 [place].

## 2013-11-30 NOTE — Progress Notes (Signed)
Pre visit review using our clinic review tool, if applicable. No additional management support is needed unless otherwise documented below in the visit note/SLS  

## 2013-11-30 NOTE — Telephone Encounter (Signed)
Lab Summary Latest Ref Rng 11/15/2013  Hemoglobin 13.0 - 17.0 g/dL 13.6  Hematocrit 39.0 - 52.0 % 40.9  White count 4.0 - 10.5 K/uL 6.5  Platelet count 150 - 400 K/uL 286  Sodium 135 - 145 mEq/L 137  Potassium 3.5 - 5.3 mEq/L 4.8  Calcium 8.4 - 10.5 mg/dL 9.5  Phosphorus  (None)  Creatinine 0.50 - 1.35 mg/dL 1.26  AST 0 - 37 U/L 21  Alk Phos 39 - 117 U/L 66  Bilirubin 0.2 - 1.2 mg/dL 0.4  Glucose 70 - 99 mg/dL 73  Cholesterol 0 - 200 mg/dL 215 (H)  HDL cholesterol >39 mg/dL 54  Triglycerides <150 mg/dL 108  LDL Direct  (None)  LDL Calc 0 - 99 mg/dL 139 (H)  Total protein 6.0 - 8.3 g/dL 6.8  Albumin 3.5 - 5.2 g/dL 4.2    PSA                     <=4.00 ng/mL           13.35 (H)   TSH                   0.350 - 4.500 uIU/mL   0.942  Patient has appointment today, 05.14.15 at 10:00am; copy of note to patient/SLS

## 2013-11-30 NOTE — Assessment & Plan Note (Signed)
Continue current medication regimen.  Reiterated importance of taking BP medication before appointments so an accurate depiction of how well the medicine is working can be obtained.  Follow-up with Cardiology as scheduled.  Follow-up 2-3 months.

## 2013-11-30 NOTE — Progress Notes (Signed)
Patient presents to clinic today for follow-up of hypertension.  Losartan was increased to 50 mg daily at last visit. Patient states home BP measurements have improved.  Does not have BP log with him today but endorses BP measurements in 120s-140/80s.  Denies chest pain, palpitations, lightheadedness, dizziness, headache or vision changes. BP is 152/90 in clinic today.  Patient has not taken BP medication this morning.  Patient has upcoming appointment with Cardiology.  Past Medical History  Diagnosis Date  . Hypertension     No current outpatient prescriptions on file prior to visit.   No current facility-administered medications on file prior to visit.    Allergies  Allergen Reactions  . Codeine Rash    With aspirin  . Cortizone-5 [Hydrocortisone Base] Rash    Family History  Problem Relation Age of Onset  . Prostate cancer Neg Hx   . Diabetes Brother   . Hypertension Neg Hx   . Breast cancer Neg Hx   . Colon cancer Neg Hx   . Heart disease Neg Hx     History   Social History  . Marital Status: Single    Spouse Name: N/A    Number of Children: N/A  . Years of Education: N/A   Social History Main Topics  . Smoking status: Former Smoker    Quit date: 03/12/1981  . Smokeless tobacco: Never Used  . Alcohol Use: Yes     Comment: occasional  . Drug Use: Yes    Special: Marijuana  . Sexual Activity: None   Other Topics Concern  . None   Social History Narrative  . None   Review of Systems - See HPI.  All other ROS are negative.  BP 152/90  Pulse 68  Temp(Src) 97.8 F (36.6 C) (Oral)  Resp 16  Ht _0  (1.778 m)  Wt 177 lb 8 oz (80.513 kg)  BMI 25.47 kg/m2  SpO2 98%  Physical Exam  Vitals reviewed. Constitutional: He is oriented to person, place, and time and well-developed, well-nourished, and in no distress.  HENT:  Head: Normocephalic and atraumatic.  Eyes: Conjunctivae are normal. Pupils are equal, round, and reactive to light.  Neck: Neck supple.   Cardiovascular: Normal rate, regular rhythm, normal heart sounds and intact distal pulses.   Pulmonary/Chest: Effort normal and breath sounds normal. No respiratory distress. He has no wheezes. He has no rales. He exhibits no tenderness.  Lymphadenopathy:    He has no cervical adenopathy.  Neurological: He is alert and oriented to person, place, and time.  Skin: Skin is warm and dry. No rash noted.  Psychiatric: Affect normal.   Recent Results (from the past 2160 hour(s))  CBC WITH DIFFERENTIAL     Status: Abnormal   Collection Time    10/27/13  3:41 PM      Result Value Ref Range   WBC 8.5  4.0 - 10.5 K/uL   RBC 5.89 (*) 4.22 - 5.81 MIL/uL   Hemoglobin 14.3  13.0 - 17.0 g/dL   HCT 42.8  39.0 - 52.0 %   MCV 72.7 (*) 78.0 - 100.0 fL   MCH 24.3 (*) 26.0 - 34.0 pg   MCHC 33.4  30.0 - 36.0 g/dL   RDW 17.3 (*) 11.5 - 15.5 %   Platelets 276  150 - 400 K/uL   Neutrophils Relative % 76  43 - 77 %   Neutro Abs 6.5  1.7 - 7.7 K/uL   Lymphocytes Relative 13  12 - 46 %  Lymphs Abs 1.1  0.7 - 4.0 K/uL   Monocytes Relative 10  3 - 12 %   Monocytes Absolute 0.8  0.1 - 1.0 K/uL   Eosinophils Relative 1  0 - 5 %   Eosinophils Absolute 0.1  0.0 - 0.7 K/uL   Basophils Relative 0  0 - 1 %   Basophils Absolute 0.0  0.0 - 0.1 K/uL  COMPREHENSIVE METABOLIC PANEL     Status: Abnormal   Collection Time    10/27/13  3:41 PM      Result Value Ref Range   Sodium 139  137 - 147 mEq/L   Potassium 4.8  3.7 - 5.3 mEq/L   Chloride 99  96 - 112 mEq/L   CO2 27  19 - 32 mEq/L   Glucose, Bld 103 (*) 70 - 99 mg/dL   BUN 24 (*) 6 - 23 mg/dL   Creatinine, Ser 1.50 (*) 0.50 - 1.35 mg/dL   Calcium 10.1  8.4 - 10.5 mg/dL   Total Protein 8.1  6.0 - 8.3 g/dL   Albumin 4.6  3.5 - 5.2 g/dL   AST 20  0 - 37 U/L   ALT 14  0 - 53 U/L   Alkaline Phosphatase 67  39 - 117 U/L   Total Bilirubin 0.4  0.3 - 1.2 mg/dL   GFR calc non Af Amer 45 (*) >90 mL/min   GFR calc Af Amer 52 (*) >90 mL/min   Comment: (NOTE)      The eGFR has been calculated using the CKD EPI equation.     This calculation has not been validated in all clinical situations.     eGFR's persistently <90 mL/min signify possible Chronic Kidney     Disease.  TROPONIN I     Status: None   Collection Time    10/27/13  3:41 PM      Result Value Ref Range   Troponin I <0.30  <0.30 ng/mL   Comment:            Due to the release kinetics of cTnI,     a negative result within the first hours     of the onset of symptoms does not rule out     myocardial infarction with certainty.     If myocardial infarction is still suspected,     repeat the test at appropriate intervals.  BASIC METABOLIC PANEL     Status: Abnormal   Collection Time    11/13/13  9:00 AM      Result Value Ref Range   Sodium 145  137 - 147 mEq/L   Potassium 4.2  3.7 - 5.3 mEq/L   Chloride 104  96 - 112 mEq/L   CO2 30  19 - 32 mEq/L   Glucose, Bld 102 (*) 70 - 99 mg/dL   BUN 12  6 - 23 mg/dL   Creatinine, Ser 1.20  0.50 - 1.35 mg/dL   Calcium 10.0  8.4 - 10.5 mg/dL   GFR calc non Af Amer 59 (*) >90 mL/min   GFR calc Af Amer 68 (*) >90 mL/min   Comment: (NOTE)     The eGFR has been calculated using the CKD EPI equation.     This calculation has not been validated in all clinical situations.     eGFR's persistently <90 mL/min signify possible Chronic Kidney     Disease.  CBC WITH DIFFERENTIAL     Status: Abnormal   Collection Time  11/15/13  9:35 AM      Result Value Ref Range   WBC 6.5  4.0 - 10.5 K/uL   RBC 5.68  4.22 - 5.81 MIL/uL   Hemoglobin 13.6  13.0 - 17.0 g/dL   HCT 40.9  39.0 - 52.0 %   MCV 72.0 (*) 78.0 - 100.0 fL   MCH 23.9 (*) 26.0 - 34.0 pg   MCHC 33.3  30.0 - 36.0 g/dL   RDW 16.9 (*) 11.5 - 15.5 %   Platelets 286  150 - 400 K/uL   Neutrophils Relative % 65  43 - 77 %   Neutro Abs 4.2  1.7 - 7.7 K/uL   Lymphocytes Relative 21  12 - 46 %   Lymphs Abs 1.4  0.7 - 4.0 K/uL   Monocytes Relative 9  3 - 12 %   Monocytes Absolute 0.6  0.1 - 1.0 K/uL    Eosinophils Relative 5  0 - 5 %   Eosinophils Absolute 0.3  0.0 - 0.7 K/uL   Basophils Relative 0  0 - 1 %   Basophils Absolute 0.0  0.0 - 0.1 K/uL   Smear Review Criteria for review not met    BASIC METABOLIC PANEL     Status: None   Collection Time    11/15/13  9:35 AM      Result Value Ref Range   Sodium 137  135 - 145 mEq/L   Potassium 4.8  3.5 - 5.3 mEq/L   Chloride 101  96 - 112 mEq/L   CO2 28  19 - 32 mEq/L   Glucose, Bld 73  70 - 99 mg/dL   BUN 22  6 - 23 mg/dL   Creat 1.26  0.50 - 1.35 mg/dL   Calcium 9.5  8.4 - 10.5 mg/dL  HEPATIC FUNCTION PANEL     Status: None   Collection Time    11/15/13  9:35 AM      Result Value Ref Range   Total Bilirubin 0.4  0.2 - 1.2 mg/dL   Bilirubin, Direct 0.1  0.0 - 0.3 mg/dL   Indirect Bilirubin 0.3  0.2 - 1.2 mg/dL   Alkaline Phosphatase 66  39 - 117 U/L   AST 21  0 - 37 U/L   ALT 13  0 - 53 U/L   Total Protein 6.8  6.0 - 8.3 g/dL   Albumin 4.2  3.5 - 5.2 g/dL  TSH     Status: None   Collection Time    11/15/13  9:35 AM      Result Value Ref Range   TSH 0.942  0.350 - 4.500 uIU/mL  URINALYSIS, ROUTINE W REFLEX MICROSCOPIC     Status: None   Collection Time    11/15/13  9:35 AM      Result Value Ref Range   Color, Urine YELLOW  YELLOW   APPearance CLEAR  CLEAR   Specific Gravity, Urine 1.012  1.005 - 1.030   pH 6.0  5.0 - 8.0   Glucose, UA NEG  NEG mg/dL   Bilirubin Urine NEG  NEG   Ketones, ur NEG  NEG mg/dL   Hgb urine dipstick NEG  NEG   Protein, ur NEG  NEG mg/dL   Urobilinogen, UA 0.2  0.0 - 1.0 mg/dL   Nitrite NEG  NEG   Leukocytes, UA NEG  NEG  PSA     Status: Abnormal   Collection Time    11/15/13  9:35 AM  Result Value Ref Range   PSA 13.35 (*) <=4.00 ng/mL   Comment: Test Methodology: ECLIA PSA (Electrochemiluminescence Immunoassay)           For PSA values from 2.5-4.0, particularly in younger men <60 years     old, the AUA and NCCN suggest testing for % Free PSA (3515) and     evaluation of the  rate of increase in PSA (PSA velocity).  LIPID PANEL     Status: Abnormal   Collection Time    11/15/13  9:35 AM      Result Value Ref Range   Cholesterol 215 (*) 0 - 200 mg/dL   Comment: ATP III Classification:           < 200        mg/dL        Desirable          200 - 239     mg/dL        Borderline High          >= 240        mg/dL        High         Triglycerides 108  <150 mg/dL   HDL 54  >39 mg/dL   Total CHOL/HDL Ratio 4.0     VLDL 22  0 - 40 mg/dL   LDL Cholesterol 139 (*) 0 - 99 mg/dL   Comment:       Total Cholesterol/HDL Ratio:CHD Risk                            Coronary Heart Disease Risk Table                                            Men       Women              1/2 Average Risk              3.4        3.3                  Average Risk              5.0        4.4               2X Average Risk              9.6        7.1               3X Average Risk             23.4       11.0     Use the calculated Patient Ratio above and the CHD Risk table      to determine the patient's CHD Risk.     ATP III Classification (LDL):           < 100        mg/dL         Optimal          100 - 129     mg/dL         Near or Above Optimal          130 - 159  mg/dL         Borderline High          160 - 189     mg/dL         High           > 190        mg/dL         Very High          Assessment/Plan: Unspecified essential hypertension Continue current medication regimen.  Reiterated importance of taking BP medication before appointments so an accurate depiction of how well the medicine is working can be obtained.  Follow-up with Cardiology as scheduled.  Follow-up 2-3 months.

## 2013-12-06 ENCOUNTER — Ambulatory Visit: Payer: Medicare Other | Admitting: Cardiology

## 2013-12-20 ENCOUNTER — Encounter: Payer: Self-pay | Admitting: Cardiology

## 2013-12-20 ENCOUNTER — Ambulatory Visit (INDEPENDENT_AMBULATORY_CARE_PROVIDER_SITE_OTHER): Payer: Medicare Other | Admitting: Cardiology

## 2013-12-20 VITALS — BP 180/100 | HR 55 | Ht 70.0 in | Wt 179.8 lb

## 2013-12-20 DIAGNOSIS — I10 Essential (primary) hypertension: Secondary | ICD-10-CM

## 2013-12-20 DIAGNOSIS — R0989 Other specified symptoms and signs involving the circulatory and respiratory systems: Secondary | ICD-10-CM | POA: Insufficient documentation

## 2013-12-20 MED ORDER — LOSARTAN POTASSIUM 100 MG PO TABS: 100.0000 mg | ORAL_TABLET | Freq: Every day | ORAL | Status: DC

## 2013-12-20 NOTE — Assessment & Plan Note (Signed)
Patient is noted to have a right bundle branch block on previous electrocardiogram. He is not having any cardiac symptoms. No plans for further evaluation

## 2013-12-20 NOTE — Progress Notes (Signed)
     HPI: 73 yo male for evaluation of abnormal ECG and hypertension. Patient denies dyspnea on exertion, orthopnea, PND, pedal edema, palpitations, syncope or chest pain.   Current Outpatient Prescriptions  Medication Sig Dispense Refill  . losartan (COZAAR) 50 MG tablet Take 1 tablet (50 mg total) by mouth daily.  30 tablet  3  . Omega-3 Fatty Acids (FISH OIL) 1000 MG CAPS Take 1 each by mouth daily.       No current facility-administered medications for this visit.    Allergies  Allergen Reactions  . Codeine Rash    With aspirin  . Cortizone-5 [Hydrocortisone Base] Rash    Past Medical History  Diagnosis Date  . Hypertension     Past Surgical History  Procedure Laterality Date  . Anal fissure repair    . Left leg surgery  1964  . Colonoscopy      History   Social History  . Marital Status: Single    Spouse Name: N/A    Number of Children: 4  . Years of Education: N/A   Occupational History  . [employer]    Social History Main Topics  . Smoking status: Former Smoker    Quit date: 03/12/1981  . Smokeless tobacco: Never Used  . Alcohol Use: Yes     Comment: occasional  . Drug Use: Yes    Special: Marijuana  . Sexual Activity: Not on file   Other Topics Concern  . Not on file   Social History Narrative  . No narrative on file    Family History  Problem Relation Age of Onset  . Prostate cancer Neg Hx   . Diabetes Brother   . Hypertension Neg Hx   . Breast cancer Neg Hx   . Colon cancer Neg Hx   . Heart disease Neg Hx     ROS: no fevers or chills, productive cough, hemoptysis, dysphasia, odynophagia, melena, hematochezia, dysuria, hematuria, rash, seizure activity, orthopnea, PND, pedal edema, claudication. Remaining systems are negative.  Physical Exam:   Blood pressure 180/100, pulse 55, height 5\' 10"  (1.778 m), weight 179 lb 12.8 oz (81.557 kg), SpO2 100.00%.  General:  Well developed/well nourished in NAD Skin warm/dry Patient not  depressed No peripheral clubbing Back-normal HEENT-normal/normal eyelids Neck supple/normal carotid upstroke bilaterally; no bruits; no JVD; no thyromegaly chest - CTA/ normal expansion CV - RRR/normal S1 and S2; no murmurs, rubs or gallops;  PMI nondisplaced Abdomen -NT/ND, no HSM, no mass, + bowel sounds, Positive bruit 2+ femoral pulses, no bruits Ext-no edema, chords, 2+ DP Neuro-grossly nonfocal  ECG 11/13/13- sinus with RBBB

## 2013-12-20 NOTE — Assessment & Plan Note (Signed)
Schedule abdominal ultrasound.

## 2013-12-20 NOTE — Patient Instructions (Signed)
Your physician recommends that you schedule a follow-up appointment in: 3 MONTHS WITH DR CRENSHAW  INCREASE LOSARTAN TO 100 MG ONCE DAILY  Your physician recommends that you return for lab work in: ONE WEEK IN [place]  Your physician has requested that you have an abdominal aorta duplex. During this test, an ultrasound is used to evaluate the aorta. Allow 30 minutes for this exam. Do not eat after midnight the day before and avoid carbonated beverages IN THE [place] LOCATION=[phone]

## 2013-12-20 NOTE — Assessment & Plan Note (Signed)
Blood pressure elevated. Increase Cozaar to 100 mg daily. Check potassium and renal function in one week. Follow blood pressure at home and add additional medications as needed.

## 2013-12-22 ENCOUNTER — Telehealth: Payer: Self-pay | Admitting: Physician Assistant

## 2013-12-22 NOTE — Telephone Encounter (Signed)
Left detailed message informing patient to reschedule his appointment with Dr. Mena Goes.

## 2013-12-22 NOTE — Telephone Encounter (Signed)
Received fax from Dr. Mena Goes (Urology) stating the patient no-showed for his appointment on 5/18.  Patient with significantly elevated PSA requiring Urology work-up.  Please call patient to advise him to reschedule with Dr. Mena Goes.  This is extremely important.

## 2013-12-25 NOTE — Telephone Encounter (Signed)
Left message for patient to return my call.

## 2013-12-25 NOTE — Telephone Encounter (Signed)
Patient states that he went to the hospital off [address] [[facility]] and I explained to him that [facility] was on [address] [[facility]] beside [facility]; he then stated that he did go to [facility] and they instructed him to go to the healthcare[??], further inquiry he stated the [facility] on [address] [our office]. Patient seems to be confused; our [facility] [[facility]] will call [facility] and get pt rescheduled/SLS

## 2013-12-25 NOTE — Telephone Encounter (Signed)
Left message on patients voicemail, awaiting return call

## 2013-12-26 ENCOUNTER — Ambulatory Visit (HOSPITAL_BASED_OUTPATIENT_CLINIC_OR_DEPARTMENT_OTHER)
Admission: RE | Admit: 2013-12-26 | Discharge: 2013-12-26 | Disposition: A | Discharge status: Home / Self Care | Payer: Medicare Other | Source: Ambulatory Visit | Attending: Cardiology | Admitting: Cardiology

## 2013-12-26 DIAGNOSIS — R0989 Other specified symptoms and signs involving the circulatory and respiratory systems: Secondary | ICD-10-CM

## 2013-12-26 DIAGNOSIS — R142 Eructation: Secondary | ICD-10-CM

## 2013-12-26 DIAGNOSIS — Z87891 Personal history of nicotine dependence: Secondary | ICD-10-CM | POA: Insufficient documentation

## 2013-12-26 DIAGNOSIS — R143 Flatulence: Secondary | ICD-10-CM

## 2013-12-26 DIAGNOSIS — R141 Gas pain: Secondary | ICD-10-CM | POA: Insufficient documentation

## 2013-12-26 DIAGNOSIS — I10 Essential (primary) hypertension: Secondary | ICD-10-CM | POA: Insufficient documentation

## 2013-12-26 NOTE — Telephone Encounter (Signed)
Contacted [facility], pt did not show up for appt. Appt is resch to see Dr Mena Goes on 7/6 @9am 

## 2013-12-29 NOTE — Telephone Encounter (Signed)
Patient informed, understood & agreed; gave pt address & phone number for [facility], will set reminder to call patient prior to appt date/SLS

## 2014-01-16 ENCOUNTER — Encounter: Payer: Self-pay | Admitting: Podiatry

## 2014-01-16 ENCOUNTER — Ambulatory Visit (INDEPENDENT_AMBULATORY_CARE_PROVIDER_SITE_OTHER): Payer: Medicare Other | Admitting: Podiatry

## 2014-01-16 VITALS — BP 158/119 | HR 54 | Ht 70.0 in | Wt 175.0 lb

## 2014-01-16 DIAGNOSIS — M79672 Pain in left foot: Secondary | ICD-10-CM | POA: Insufficient documentation

## 2014-01-16 DIAGNOSIS — Q6689 Other  specified congenital deformities of feet: Secondary | ICD-10-CM

## 2014-01-16 DIAGNOSIS — M79609 Pain in unspecified limb: Secondary | ICD-10-CM

## 2014-01-16 DIAGNOSIS — M216X9 Other acquired deformities of unspecified foot: Secondary | ICD-10-CM | POA: Insufficient documentation

## 2014-01-16 DIAGNOSIS — M722 Plantar fascial fibromatosis: Secondary | ICD-10-CM

## 2014-01-16 DIAGNOSIS — M21969 Unspecified acquired deformity of unspecified lower leg: Secondary | ICD-10-CM

## 2014-01-16 DIAGNOSIS — M21962 Unspecified acquired deformity of left lower leg: Secondary | ICD-10-CM | POA: Insufficient documentation

## 2014-01-16 NOTE — Progress Notes (Signed)
SUBJECTIVE: 73 y.o. year old male presents complaining of left foot arch pain duration of 6 months, off and on. He is a disabled Administrator, [employer].  Pain is at left instep and under the lesser digits at times. Plays a lot of Golf.   OBJECTIVE: DERMATOLOGIC EXAMINATION: Normal findings. VASCULAR EXAMINATION OF LOWER LIMBS: Pedal pulses: All pedal pulses are palpable with normal pulsation.  NEUROLOGIC EXAMINATION OF THE LOWER LIMBS: All epicritic and tactile sensations grossly intact.  MUSCULOSKELETAL EXAMINATION: High arched cavus type foot with dorsal displacement of the first ray bilateral.  Contracted lesser digits left foot. Tight Achilles tendon bilateral.  ASSESSMENT: Plantar fasciitis left. 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 Contracted lesser digits left.  PLAN: Reviewed clinical findings and available options; custom orthotics, metatarsal binders, well supported shoes.  OTC JM 720 size 10 dispensed.

## 2014-01-16 NOTE — Patient Instructions (Signed)
Seen for pain on left foot.  Need to have Museum/gallery exhibitions officerCustom Orthotics. OTC JM 720 size 10 dispensed 1 pair.  Return in 2 weeks.

## 2014-01-30 ENCOUNTER — Ambulatory Visit (INDEPENDENT_AMBULATORY_CARE_PROVIDER_SITE_OTHER): Payer: Medicare Other | Admitting: Podiatry

## 2014-01-30 ENCOUNTER — Encounter: Payer: Self-pay | Admitting: Podiatry

## 2014-01-30 VITALS — BP 173/68 | HR 72

## 2014-01-30 DIAGNOSIS — M216X9 Other acquired deformities of unspecified foot: Secondary | ICD-10-CM

## 2014-01-30 DIAGNOSIS — M21969 Unspecified acquired deformity of unspecified lower leg: Secondary | ICD-10-CM

## 2014-01-30 DIAGNOSIS — Q6689 Other  specified congenital deformities of feet: Secondary | ICD-10-CM

## 2014-01-30 DIAGNOSIS — M722 Plantar fascial fibromatosis: Secondary | ICD-10-CM

## 2014-01-30 DIAGNOSIS — M79672 Pain in left foot: Secondary | ICD-10-CM

## 2014-01-30 DIAGNOSIS — M79609 Pain in unspecified limb: Secondary | ICD-10-CM

## 2014-01-30 NOTE — Patient Instructions (Signed)
Doing well with OTC orthotics. Continue with orthotics. May need custom orthotics.

## 2014-01-30 NOTE — Progress Notes (Signed)
Subjective:  73 year old male presents for 2 week follow up on plantar fasciitis with elevated first ray left. On his last visit, he took OTC orthotics. Toes are not burning as much. Orthotic helped. Doing stretch exercise.   OBJECTIVE: No new changes.  ASSESSMENT:  Plantar fasciitis left.  Metatarsus primus elevatus left.  Ankle Equinus bilateral.  Contracted lesser digits left.   PLAN:  Continue with OTC orthotics. Return for custom orthotics.

## 2014-02-06 ENCOUNTER — Encounter: Payer: Self-pay | Admitting: *Deleted

## 2014-02-24 ENCOUNTER — Emergency Department (HOSPITAL_COMMUNITY): Payer: Medicare Other

## 2014-02-24 ENCOUNTER — Encounter (HOSPITAL_COMMUNITY): Payer: Self-pay | Admitting: Emergency Medicine

## 2014-02-24 ENCOUNTER — Emergency Department (HOSPITAL_COMMUNITY)
Admission: EM | Admit: 2014-02-24 | Discharge: 2014-02-24 | Disposition: A | Discharge status: Home / Self Care | Payer: Medicare Other | Attending: Emergency Medicine | Admitting: Emergency Medicine

## 2014-02-24 DIAGNOSIS — Z87891 Personal history of nicotine dependence: Secondary | ICD-10-CM | POA: Insufficient documentation

## 2014-02-24 DIAGNOSIS — I10 Essential (primary) hypertension: Secondary | ICD-10-CM | POA: Insufficient documentation

## 2014-02-24 DIAGNOSIS — Z79899 Other long term (current) drug therapy: Secondary | ICD-10-CM | POA: Insufficient documentation

## 2014-02-24 DIAGNOSIS — R110 Nausea: Secondary | ICD-10-CM | POA: Insufficient documentation

## 2014-02-24 DIAGNOSIS — R55 Syncope and collapse: Secondary | ICD-10-CM | POA: Diagnosis present

## 2014-02-24 LAB — COMPREHENSIVE METABOLIC PANEL
ALBUMIN: 3.8 g/dL (ref 3.5–5.2)
ALK PHOS: 62 U/L (ref 39–117)
ALT: 15 U/L (ref 0–53)
AST: 23 U/L (ref 0–37)
Anion gap: 14 (ref 5–15)
BILIRUBIN TOTAL: 0.3 mg/dL (ref 0.3–1.2)
BUN: 17 mg/dL (ref 6–23)
CHLORIDE: 100 meq/L (ref 96–112)
CO2: 26 mEq/L (ref 19–32)
Calcium: 9.2 mg/dL (ref 8.4–10.5)
Creatinine, Ser: 1.35 mg/dL (ref 0.50–1.35)
GFR calc Af Amer: 59 mL/min — ABNORMAL LOW (ref >90)
GFR calc non Af Amer: 51 mL/min — ABNORMAL LOW (ref >90)
Glucose, Bld: 108 mg/dL — ABNORMAL HIGH (ref 70–99)
POTASSIUM: 4.3 meq/L (ref 3.7–5.3)
SODIUM: 140 meq/L (ref 137–147)
Total Protein: 6.9 g/dL (ref 6.0–8.3)

## 2014-02-24 LAB — CBC WITH DIFFERENTIAL/PLATELET
BASOS ABS: 0.1 10*3/uL (ref 0.0–0.1)
Basophils Relative: 1 % (ref 0–1)
Eosinophils Absolute: 0.2 10*3/uL (ref 0.0–0.7)
Eosinophils Relative: 3 % (ref 0–5)
HCT: 37.4 % — ABNORMAL LOW (ref 39.0–52.0)
HEMOGLOBIN: 12 g/dL — AB (ref 13.0–17.0)
Lymphocytes Relative: 29 % (ref 12–46)
Lymphs Abs: 1.8 10*3/uL (ref 0.7–4.0)
MCH: 23.7 pg — AB (ref 26.0–34.0)
MCHC: 32.1 g/dL (ref 30.0–36.0)
MCV: 73.9 fL — ABNORMAL LOW (ref 78.0–100.0)
Monocytes Absolute: 0.6 10*3/uL (ref 0.1–1.0)
Monocytes Relative: 10 % (ref 3–12)
NEUTROS ABS: 3.6 10*3/uL (ref 1.7–7.7)
NEUTROS PCT: 57 % (ref 43–77)
PLATELETS: 225 10*3/uL (ref 150–400)
RBC: 5.06 MIL/uL (ref 4.22–5.81)
RDW: 15.7 % — AB (ref 11.5–15.5)
WBC: 6.3 10*3/uL (ref 4.0–10.5)

## 2014-02-24 LAB — TROPONIN I: Troponin I: <0.3 ng/mL (ref <0.30)

## 2014-02-24 LAB — CBG MONITORING, ED: Glucose-Capillary: 127 mg/dL — ABNORMAL HIGH (ref 70–99)

## 2014-02-24 MED ORDER — GI COCKTAIL ~~LOC~~
30.0000 mL | Freq: Once | ORAL | Status: AC
  Administered 2014-02-24: 30 mL via ORAL
  Filled 2014-02-24: qty 30

## 2014-02-24 MED ORDER — ONDANSETRON HCL 4 MG/2ML IJ SOLN
4.0000 mg | Freq: Once | INTRAMUSCULAR | Status: AC
  Administered 2014-02-24: 4 mg via INTRAVENOUS
  Filled 2014-02-24: qty 2

## 2014-02-24 MED ORDER — RANITIDINE HCL 150 MG PO CAPS: 150.0000 mg | ORAL_CAPSULE | Freq: Every day | ORAL | Status: DC

## 2014-02-24 MED ORDER — SODIUM CHLORIDE 0.9 % IV BOLUS (SEPSIS)
1000.0000 mL | Freq: Once | INTRAVENOUS | Status: AC
  Administered 2014-02-24: 1000 mL via INTRAVENOUS

## 2014-02-24 NOTE — ED Notes (Signed)
EKG showed RBBB, normal per history. Pt refused IV or zofran by EMS.

## 2014-02-24 NOTE — ED Notes (Signed)
Spoke with patient about use of zofran, pt agreeable to receive medication.

## 2014-02-24 NOTE — ED Provider Notes (Signed)
CSN: [id number]     Arrival date & time 02/24/14  1645 History   First MD Initiated Contact with Patient 02/24/14 1647     Chief Complaint  Patient presents with  . Near Syncope     (Consider location/radiation/quality/duration/timing/severity/associated sxs/prior Treatment) Patient is a 72 y.o. male presenting with near-syncope. The history is provided by the patient.  Near Syncope This is a new problem. The current episode started less than 1 hour ago. Episode frequency: once. The problem has been resolved. Pertinent negatives include no chest pain, no abdominal pain, no headaches and no shortness of breath. Nothing aggravates the symptoms. Nothing relieves the symptoms. He has tried nothing for the symptoms. The treatment provided significant relief.    Past Medical History  Diagnosis Date  . Hypertension    Past Surgical History  Procedure Laterality Date  . Anal fissure repair    . Left leg surgery  1964  . Colonoscopy     Family History  Problem Relation Age of Onset  . Prostate cancer Neg Hx   . Diabetes Brother   . Hypertension Neg Hx   . Breast cancer Neg Hx   . Colon cancer Neg Hx   . Heart disease Neg Hx    History  Substance Use Topics  . Smoking status: Former Smoker    Quit date: 03/12/1981  . Smokeless tobacco: Never Used  . Alcohol Use: Yes     Comment: occasional    Review of Systems  Constitutional: Negative for fever.  HENT: Negative for drooling and rhinorrhea.   Eyes: Negative for pain.  Respiratory: Negative for cough and shortness of breath.   Cardiovascular: Positive for near-syncope. Negative for chest pain and leg swelling.  Gastrointestinal: Positive for nausea. Negative for vomiting, abdominal pain and diarrhea.  Genitourinary: Negative for dysuria and hematuria.  Musculoskeletal: Negative for gait problem and neck pain.  Skin: Negative for color change.  Neurological: Negative for numbness and headaches.  Hematological: Negative for  adenopathy.  Psychiatric/Behavioral: Negative for behavioral problems.  All other systems reviewed and are negative.     Allergies  Codeine and Cortizone-5  Home Medications   Prior to Admission medications   Medication Sig Start Date End Date Taking? Authorizing Provider  losartan (COZAAR) 100 MG tablet Take 1 tablet (100 mg total) by mouth daily. 12/20/13   Lewayne Bunting, MD  Omega-3 Fatty Acids (FISH OIL) 1000 MG CAPS Take 1 each by mouth daily.    Historical Provider, MD   BP 152/67  Pulse 55  Temp(Src) 97.3 F (36.3 C) (Oral)  Resp 13  SpO2 99% Physical Exam  Nursing note and vitals reviewed. Constitutional: He is oriented to person, place, and time. He appears well-developed and well-nourished.  HENT:  Head: Normocephalic and atraumatic.  Right Ear: External ear normal.  Left Ear: External ear normal.  Nose: Nose normal.  Mouth/Throat: Oropharynx is clear and moist. No oropharyngeal exudate.  Eyes: Conjunctivae and EOM are normal. Pupils are equal, round, and reactive to light.  Neck: Normal range of motion. Neck supple.  Cardiovascular: Normal rate, regular rhythm, normal heart sounds and intact distal pulses.  Exam reveals no gallop and no friction rub.   No murmur heard. Pulmonary/Chest: Effort normal and breath sounds normal. No respiratory distress. He has no wheezes.  Abdominal: Soft. Bowel sounds are normal. He exhibits no distension. There is no tenderness. There is no rebound and no guarding.  Musculoskeletal: Normal range of motion. He exhibits no edema and  no tenderness.  Neurological: He is alert and oriented to person, place, and time. He displays a negative Romberg sign.  alert, oriented x3 speech: normal in context and clarity memory: intact grossly cranial nerves II-XII: intact motor strength: full proximally and distally no involuntary movements or tremors sensation: intact to light touch diffusely  cerebellar: finger-to-nose and heel-to-shin  intact gait: normal forwards and backwards   Skin: Skin is warm and dry.  Psychiatric: He has a normal mood and affect. His behavior is normal.    ED Course  Procedures (including critical care time) Labs Review Labs Reviewed  CBC WITH DIFFERENTIAL - Abnormal; Notable for the following:    Hemoglobin 12.0 (*)    HCT 37.4 (*)    MCV 73.9 (*)    MCH 23.7 (*)    RDW 15.7 (*)    All other components within normal limits  COMPREHENSIVE METABOLIC PANEL - Abnormal; Notable for the following:    Glucose, Bld 108 (*)    GFR calc non Af Amer 51 (*)    GFR calc Af Amer 59 (*)    All other components within normal limits  CBG MONITORING, ED - Abnormal; Notable for the following:    Glucose-Capillary 127 (*)    All other components within normal limits  TROPONIN I    Imaging Review Dg Chest 2 View  02/24/2014   CLINICAL DATA:  Near syncope, nausea  EXAM: CHEST  2 VIEW  COMPARISON:  03/20/2011  FINDINGS: Low lung volumes. Lungs are essentially clear. No pleural effusion or pneumothorax.  The heart is normal in size.  Visualized osseous structures are within normal limits.  IMPRESSION: No evidence of acute cardiopulmonary disease.   Electronically Signed   By: Charline BillsSriyesh  Krishnan M.D.   On: 02/24/2014 18:09     EKG Interpretation   Date/Time:  Saturday February 24 2014 16:53:17 EDT Ventricular Rate:  52 PR Interval:  179 QRS Duration: 138 QT Interval:  471 QTC Calculation: 438 R Axis:   48 Text Interpretation:  Sinus rhythm Right bundle branch block No  significant change since last tracing Confirmed by Kyjuan Gause  MD, Torian Thoennes  ([id number]) on 02/24/2014 5:10:15 PM      MDM   Final diagnoses:  Vasovagal near syncope    5:07 PM 73 y.o. male w hx of HTN presents with a presyncopal episode which occurred prior to arrival. The patient states that he was at a 45 angle at the barber shot getting his neck shaved when he felt a curtain come over him and had generalized weakness. He states that he  felt "out of it" for about 5-10 minutes and his symptoms slowly resolved. He states that he had nausea but denies any chest pain, shortness of breath, or headache. He notes some mild ongoing nausea but is otherwise asymptomatic. Likely vasovagal. Vital signs unremarkable here. Will get screening labs and imaging. The patient also notes intermittent uncomfortable GI sensation before and after he eats. He denies any pain but just states it is uncomfortable. He has no tenderness to palpation on exam. Will give a GI cocktail.  7:47 PM: I interpreted/reviewed the labs and/or imaging which were non-contributory.  Likely vasovagal near syncope. He continues to appear well. Will start zantac d/t vague occasional gi discomfort.  I have discussed the diagnosis/risks/treatment options with the patient and believe the pt to be eligible for discharge home to follow-up with his pcp as needed. We also discussed returning to the ED immediately if new or worsening  sx occur. We discussed the sx which are most concerning (e.g., further episodes of syncope, sob, cp) that necessitate immediate return. Medications administered to the patient during their visit and any new prescriptions provided to the patient are listed below.  Medications given during this visit Medications  sodium chloride 0.9 % bolus 1,000 mL (0 mLs Intravenous Stopped 02/24/14 1830)  ondansetron (ZOFRAN) injection 4 mg (4 mg Intravenous Given 02/24/14 1727)  gi cocktail (Maalox,Lidocaine,Donnatal) (30 mLs Oral Given 02/24/14 1727)    New Prescriptions   RANITIDINE (ZANTAC) 150 MG CAPSULE    Take 1 capsule (150 mg total) by mouth daily.       Purvis Sheffield, MD 02/24/14 1949

## 2014-02-24 NOTE — ED Notes (Signed)
Per [facility], pt had near syncopal episode. Pt was at barber shop. Sitting in the chair, pt started "feeling so bad, almost blacked out". Pt went to the bathroom and was nauseous, but no vomting. Pt states he ate fried chicken right before he went to the shop. Pt denies, CP, SOB, diaphoresis. CBG 124.

## 2014-02-24 NOTE — ED Notes (Signed)
Pt refusing zofran.

## 2014-04-04 ENCOUNTER — Ambulatory Visit: Payer: Medicare Other | Admitting: Cardiology

## 2016-03-31 ENCOUNTER — Telehealth: Payer: Self-pay | Admitting: Physician Assistant

## 2016-03-31 NOTE — Telephone Encounter (Signed)
Unable to LM, VM full, called to schedule AWV with RN

## 2017-01-18 ENCOUNTER — Emergency Department (HOSPITAL_BASED_OUTPATIENT_CLINIC_OR_DEPARTMENT_OTHER): Payer: Medicare Other

## 2017-01-18 ENCOUNTER — Emergency Department (HOSPITAL_BASED_OUTPATIENT_CLINIC_OR_DEPARTMENT_OTHER)
Admission: EM | Admit: 2017-01-18 | Discharge: 2017-01-18 | Disposition: A | Discharge status: Home / Self Care | Payer: Medicare Other | Attending: Emergency Medicine | Admitting: Emergency Medicine

## 2017-01-18 ENCOUNTER — Encounter (HOSPITAL_BASED_OUTPATIENT_CLINIC_OR_DEPARTMENT_OTHER): Payer: Self-pay | Admitting: *Deleted

## 2017-01-18 DIAGNOSIS — Y999 Unspecified external cause status: Secondary | ICD-10-CM | POA: Insufficient documentation

## 2017-01-18 DIAGNOSIS — Y939 Activity, unspecified: Secondary | ICD-10-CM | POA: Insufficient documentation

## 2017-01-18 DIAGNOSIS — M79605 Pain in left leg: Secondary | ICD-10-CM | POA: Diagnosis present

## 2017-01-18 DIAGNOSIS — M79662 Pain in left lower leg: Secondary | ICD-10-CM

## 2017-01-18 DIAGNOSIS — I10 Essential (primary) hypertension: Secondary | ICD-10-CM | POA: Insufficient documentation

## 2017-01-18 DIAGNOSIS — Y929 Unspecified place or not applicable: Secondary | ICD-10-CM | POA: Diagnosis not present

## 2017-01-18 DIAGNOSIS — Z79899 Other long term (current) drug therapy: Secondary | ICD-10-CM | POA: Diagnosis not present

## 2017-01-18 DIAGNOSIS — X58XXXA Exposure to other specified factors, initial encounter: Secondary | ICD-10-CM | POA: Diagnosis not present

## 2017-01-18 DIAGNOSIS — Z87891 Personal history of nicotine dependence: Secondary | ICD-10-CM | POA: Insufficient documentation

## 2017-01-18 DIAGNOSIS — S5001XA Contusion of right elbow, initial encounter: Secondary | ICD-10-CM | POA: Insufficient documentation

## 2017-01-18 NOTE — ED Notes (Signed)
Patient stated that he fell 3 days ago and his left leg starts to swell up. He felt a shooting, sharp from the left  lower leg all the way to the knee (just below the knee).   He does have numbness to the bottom of his foot for a few days since he fell.

## 2017-01-18 NOTE — ED Triage Notes (Signed)
Pain in his left leg on and off. No injury. States he has had the pain for years. He had numbness in his foot a week ago that caused him to fall and land on his right elbow. Pain in his elbow.

## 2017-01-19 NOTE — ED Provider Notes (Signed)
[facility]   CSN: [id number] Arrival date & time: 01/18/17  1536     History   Chief Complaint Chief Complaint  Patient presents with  . Leg Pain    HPI Jonathan Obrien is a 76 y.o. male.  76 year old male with past medical history below who presents with left leg and right elbow pain. The patient has had intermittent left lower leg pain for years related to a laceration injury along time ago. He has problems with numbness on the bottom of his foot. He fell 3 days ago, did not strike his head or lose consciousness, but landed on his right elbow and has had pain there since. He is also had an exacerbation of his left lower leg pain. He denies any vomiting or neck pain. No other injuries from the fall. He has never seen an orthopedist for his leg pain.   The history is provided by the patient.  Leg Pain      Past Medical History:  Diagnosis Date  . Hypertension     Patient Active Problem List   Diagnosis Date Noted  . Arch pain of left foot 01/16/2014  . Plantar fasciitis of left foot 01/16/2014  . Deformity of metatarsal bone of left foot 01/16/2014  . Equinus deformity of foot 01/16/2014  . Bruit 12/20/2013  . Medicare annual wellness visit, subsequent 11/15/2013  . Prostate cancer screening 11/15/2013  . HTN (hypertension) 10/31/2013  . RBBB 10/26/2013  . Hyperlipidemia 05/02/2012  . Radicular pain of left lower extremity 05/02/2012  . Arthritis 04/06/2012  . Unspecified essential hypertension 09/17/2011    Past Surgical History:  Procedure Laterality Date  . ANAL FISSURE REPAIR    . COLONOSCOPY    . left leg surgery  1964       Home Medications    Prior to Admission medications   Medication Sig Start Date End Date Taking? Authorizing Provider  AMLODIPINE-ATORVASTATIN PO Take by mouth.   Yes [provider]  Phenyleph-CPM-DM-APAP (ALKA-SELTZER PLUS COLD & COUGH) 11-18-08-325 MG CAPS Take 2 capsules by mouth daily as  needed (for cold).     [provider]  ranitidine (ZANTAC) 150 MG capsule Take 1 capsule (150 mg total) by mouth daily. 02/24/14   Purvis SheffieldHarrison, Forrest, MD    Family History Family History  Problem Relation Age of Onset  . Diabetes Brother   . Prostate cancer Neg Hx   . Hypertension Neg Hx   . Breast cancer Neg Hx   . Colon cancer Neg Hx   . Heart disease Neg Hx     Social History Social History  Substance Use Topics  . Smoking status: Former Smoker    Quit date: 03/12/1981  . Smokeless tobacco: Never Used  . Alcohol use Yes     Comment: occasional     Allergies   Codeine and Cortizone-5 [hydrocortisone base]   Review of Systems Review of Systems All other systems reviewed and are negative except that which was mentioned in HPI  Physical Exam Updated Vital Signs BP 140/82 (BP Location: Left Arm)   Pulse (!) 56   Temp 98.2 F (36.8 C) (Oral)   Resp 18   Ht 5\' 10"  (1.778 m)   Wt 81.6 kg (180 lb)   SpO2 98%   BMI 25.83 kg/m   Physical Exam  Constitutional: He is oriented to person, place, and time. He appears well-developed and well-nourished. No distress.  HENT:  Head: Normocephalic and atraumatic.  Moist  mucous membranes  Eyes: Conjunctivae are normal. Pupils are equal, round, and reactive to light.  Neck: Neck supple.  Cardiovascular: Normal rate, regular rhythm, normal heart sounds and intact distal pulses.   No murmur heard. Pulmonary/Chest: Effort normal and breath sounds normal.  Abdominal: Soft. Bowel sounds are normal. He exhibits no distension. There is no tenderness.  Musculoskeletal: He exhibits tenderness. He exhibits no edema or deformity.  Scar on posterior L distal lower leg above ankle, tenderness to light touch of lower leg and foot without swelling or redness; normal ROM L elbow and normal grip strength  Neurological: He is alert and oriented to person, place, and time. No sensory deficit.  Fluent speech  Skin: Skin is warm and dry. No  erythema.  Psychiatric: He has a normal mood and affect. Judgment normal.  Nursing note and vitals reviewed.    ED Treatments / Results  Labs (all labs ordered are listed, but only abnormal results are displayed) Labs Reviewed - No data to display  EKG  EKG Interpretation None       Radiology Dg Elbow Complete Right  Result Date: 01/18/2017 CLINICAL DATA:  Fall 4 days ago, elbow pain into right shoulder, initial encounter. EXAM: RIGHT ELBOW - COMPLETE 3+ VIEW COMPARISON:  None. FINDINGS: Tiny osseous fragment along the medial aspect of the proximal ulna appears well corticated. No definite fracture. No joint effusion. IMPRESSION: No definite fracture.  No joint effusion. Electronically Signed   By: Leanna Battles M.D.   On: 01/18/2017 16:48   Dg Tibia/fibula Left  Result Date: 01/18/2017 CLINICAL DATA:  LEFT lower leg pain. Remote trauma to the distal tibia EXAM: LEFT TIBIA AND FIBULA - 2 VIEW COMPARISON:  None. FINDINGS: No fracture of the tibia or fibula. Knee joint and ankle joint appear normal on two views. There is a smooth well corticated bony excrescence along the posterolateral aspect of the distal tibia. No periosteal reaction. IMPRESSION: 1. No acute findings of the tibia fibula. 2. Benign-appearing bony excrescence along the distal aspect of the posteromedial tibia may represent osteochondroma or relate to reported history of trauma. Electronically Signed   By: Genevive Bi M.D.   On: 01/18/2017 16:55    Procedures Procedures (including critical care time)  Medications Ordered in ED Medications - No data to display   Initial Impression / Assessment and Plan / ED Course  I have reviewed the triage vital signs and the nursing notes.  Pertinent imaging results that were available during my care of the patient were reviewed by me and considered in my medical decision making (see chart for details).    XR of leg and knee negative for acute injury. His paresthesias  and exquisite sensitivity to skin near his previous leg injury site suggest possible neuropathic pain. He has no evidence of acute injury or infection at this site. He has normal range of motion of his elbow therefore I doubt serious injury. Have discussed supportive measures for his pain and recommended that he follow-up with an orthopedist regarding his leg symptoms. Patient voiced understanding of plan and was discharged in satisfactory condition.  Final Clinical Impressions(s) / ED Diagnoses   Final diagnoses:  Pain in left lower leg  Contusion of right elbow, initial encounter    New Prescriptions Discharge Medication List as of 01/18/2017  6:44 PM       Little, Ambrose Finland, MD 01/19/17 [phone]

## 2017-01-28 ENCOUNTER — Ambulatory Visit (INDEPENDENT_AMBULATORY_CARE_PROVIDER_SITE_OTHER): Payer: Medicare Other | Admitting: Orthopedic Surgery

## 2017-01-28 DIAGNOSIS — M79605 Pain in left leg: Secondary | ICD-10-CM

## 2017-01-31 ENCOUNTER — Encounter (INDEPENDENT_AMBULATORY_CARE_PROVIDER_SITE_OTHER): Payer: Self-pay | Admitting: Orthopedic Surgery

## 2017-01-31 NOTE — Progress Notes (Signed)
Office Visit Note   Patient: Jonathan Obrien           Date of Birth: 1941-06-04           MRN: [id number] Visit Date: 01/28/2017 Requested by: Waldon MerlMartin, William C, PA-C [address] [place], [place] [id number] PCP: Noel JourneyMartin, William C, PA-C  Subjective: Chief Complaint  Patient presents with  . Left Leg - Injury    HPI: Jonathan Obrien is a patient with left foot and lower chrie pain.  Had a fall 01/15/2017.  Does have history of prior injury while he was in the [employer] [place].  He had a significant laceration down around the junction of the proximal two thirds and distal one third of his tibia.  He feels a bump in this area.  Describing numbness in his foot causing him to fall.  He has tib-fib x-rays on the computer which do show a periosteal reaction type of situation down around the area of this laceration.  It is posterior medial in the area but the saphenous nerve and the tibial nerve.  [place]              ROS: All systems reviewed are negative as they relate to the chief complaint within the history of present illness.  Patient denies  fevers or chills.   Assessment & Plan: Visit Diagnoses:  1. 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  He doesn't have any motor weakness but definitely has evidence of saphenous nerve irritation as well as tibial nerve irritation.  Options for treatment would be observation versus excision.  The mass is palpable.  There is a Tinel's in the region.  I think excision would be a reasonable option for Jonathan Obrien if he feels like symptoms warrant it.  I will see him back as needed  Follow-Up Instructions: Return if symptoms worsen or fail to improve.   Orders:  No orders of the defined types were placed in this encounter.  No orders of the defined types were placed in this  encounter.     Procedures: No procedures performed   Clinical Data: No additional findings.  Objective: Vital Signs: There were no vitals taken for this visit.  Physical Exam:   Constitutional: Patient appears well-developed HEENT:  Head: Normocephalic Eyes:EOM are normal Neck: Normal range of motion Cardiovascular: Normal rate Pulmonary/chest: Effort normal Neurologic: Patient is alert Skin: Skin is warm Psychiatric: Patient has normal mood and affect    Ortho Exam: Orthopedic exam demonstrates slightly antalgic gait to the left.  He has palpable pedal pulses and a palpable hard contiguous bony mass measuring about 3 x 3 cm on the left lower extremity the junction of the proximal two thirds and distal one third of the tibia.  Does have Tinel's in this area.  Ankle dorsiflex and plantar flexion inversion and eversion strength is intact.  No real muscle atrophy is present.  Healed transverse laceration is present.  Plantarflexion strength is actually pretty reasonable.  There is only mild Atrophy left versus right  Specialty Comments:  No specialty comments available.  Imaging: No results found.   PMFS History: Patient Active Problem List   Diagnosis Date Noted  . Arch pain of left foot 01/16/2014  . Plantar fasciitis of left foot 01/16/2014  .  Deformity of metatarsal bone of left foot 01/16/2014  . Equinus deformity of foot 01/16/2014  . Bruit 12/20/2013  . Medicare annual wellness visit, subsequent 11/15/2013  . Prostate cancer screening 11/15/2013  . HTN (hypertension) 10/31/2013  . RBBB 10/26/2013  . Hyperlipidemia 05/02/2012  . Radicular pain of left lower extremity 05/02/2012  . Arthritis 04/06/2012  . Unspecified essential hypertension 09/17/2011   Past Medical History:  Diagnosis Date  . Hypertension     Family History  Problem Relation Age of Onset  . Diabetes Brother   . Prostate cancer Neg Hx   . Hypertension Neg Hx   . Breast cancer Neg Hx    . Colon cancer Neg Hx   . Heart disease Neg Hx     Past Surgical History:  Procedure Laterality Date  . ANAL FISSURE REPAIR    . COLONOSCOPY    . left leg surgery  1964   Social History   Occupational History  . [employer]   Social History Main Topics  . Smoking status: Former Smoker    Quit date: 03/12/1981  . Smokeless tobacco: Never Used  . Alcohol use Yes     Comment: occasional  . Drug use: Yes    Types: Marijuana  . Sexual activity: Not on file

## 2017-03-03 ENCOUNTER — Encounter (HOSPITAL_BASED_OUTPATIENT_CLINIC_OR_DEPARTMENT_OTHER): Payer: Self-pay

## 2017-03-03 ENCOUNTER — Emergency Department (HOSPITAL_BASED_OUTPATIENT_CLINIC_OR_DEPARTMENT_OTHER)
Admission: EM | Admit: 2017-03-03 | Discharge: 2017-03-03 | Disposition: A | Discharge status: Home / Self Care | Payer: Medicare Other | Attending: Emergency Medicine | Admitting: Emergency Medicine

## 2017-03-03 DIAGNOSIS — Z79899 Other long term (current) drug therapy: Secondary | ICD-10-CM | POA: Insufficient documentation

## 2017-03-03 DIAGNOSIS — Z87891 Personal history of nicotine dependence: Secondary | ICD-10-CM | POA: Diagnosis not present

## 2017-03-03 DIAGNOSIS — M79641 Pain in right hand: Secondary | ICD-10-CM | POA: Diagnosis not present

## 2017-03-03 DIAGNOSIS — M79642 Pain in left hand: Secondary | ICD-10-CM | POA: Diagnosis present

## 2017-03-03 DIAGNOSIS — R252 Cramp and spasm: Secondary | ICD-10-CM | POA: Insufficient documentation

## 2017-03-03 DIAGNOSIS — I129 Hypertensive chronic kidney disease with stage 1 through stage 4 chronic kidney disease, or unspecified chronic kidney disease: Secondary | ICD-10-CM | POA: Diagnosis not present

## 2017-03-03 DIAGNOSIS — N189 Chronic kidney disease, unspecified: Secondary | ICD-10-CM | POA: Diagnosis not present

## 2017-03-03 DIAGNOSIS — N289 Disorder of kidney and ureter, unspecified: Secondary | ICD-10-CM

## 2017-03-03 LAB — BASIC METABOLIC PANEL
Anion gap: 9 (ref 5–15)
BUN: 30 mg/dL — AB (ref 6–20)
CO2: 27 mmol/L (ref 22–32)
Calcium: 9.1 mg/dL (ref 8.9–10.3)
Chloride: 106 mmol/L (ref 101–111)
Creatinine, Ser: 2.14 mg/dL — ABNORMAL HIGH (ref 0.61–1.24)
GFR calc Af Amer: 33 mL/min — ABNORMAL LOW (ref >60)
GFR calc non Af Amer: 29 mL/min — ABNORMAL LOW (ref >60)
GLUCOSE: 128 mg/dL — AB (ref 65–99)
Potassium: 4.2 mmol/L (ref 3.5–5.1)
Sodium: 142 mmol/L (ref 135–145)

## 2017-03-03 NOTE — Discharge Instructions (Signed)
Return to the ED with any concerns including swelling of arms or legs, worsening symptoms, fainting, vomiting and not able to keep down liquids, decreased level of alertness/lethargy, or any other alarming symptoms  You should be sure to see your primary care doctor, to have your kidney labs rechecked.

## 2017-03-03 NOTE — ED Provider Notes (Signed)
[facility]   CSN: [id number] Arrival date & time: 03/03/17  2027  By signing my name below, I, Rosario Adie, attest that this documentation has been prepared under the direction and in the presence of Amareon Phung, Latanya Maudlin, MD. Electronically Signed: Rosario Adie, ED Scribe. 03/03/17. 9:59 PM.  History   Chief Complaint Chief Complaint  Patient presents with  . Hand Pain   The history is provided by the patient. No language interpreter was used.    HPI Comments: Jonathan Obrien is a left hand dominant 76 y.o. male w/ a h/o HTN, HLD, arthritis, who presents to the Emergency Department complaining of persistent bilateral cramping hand pain (L>R) beginning tonight. He reports some radiation of his pain into his left wrist. Pt reports that he received an PNA injection this afternoon and tonight when returning home he had an acute onset of bilateral cramping hand pain. Pt reports that he does frequently work with his hands daily performing repetitive motions, however, this is a non-typical pain for him. No recent injury to the hands. He reports that his pain is worse and tight with extension of the digits. No noted treatments for his symptoms were tried prior to coming into the ED. He also reports some numbness into the first three digits of the left hand at night. He denies weakness, fever, or any other associated symptoms. There are no other associated systemic symptoms, there are no other alleviating or modifying factors.   Past Medical History:  Diagnosis Date  . Hypertension    Patient Active Problem List   Diagnosis Date Noted  . Arch pain of left foot 01/16/2014  . Plantar fasciitis of left foot 01/16/2014  . Deformity of metatarsal bone of left foot 01/16/2014  . Equinus deformity of foot 01/16/2014  . Bruit 12/20/2013  . Medicare annual wellness visit, subsequent 11/15/2013  . Prostate cancer screening 11/15/2013  . HTN (hypertension)  10/31/2013  . RBBB 10/26/2013  . Hyperlipidemia 05/02/2012  . Radicular pain of left lower extremity 05/02/2012  . Arthritis 04/06/2012  . Unspecified essential hypertension 09/17/2011   Past Surgical History:  Procedure Laterality Date  . ANAL FISSURE REPAIR    . COLONOSCOPY    . left leg surgery  1964    Home Medications    Prior to Admission medications   Medication Sig Start Date End Date Taking? Authorizing Provider  AMLODIPINE-ATORVASTATIN PO Take by mouth.    [provider]   Family History Family History  Problem Relation Age of Onset  . Diabetes Brother   . Prostate cancer Neg Hx   . Hypertension Neg Hx   . Breast cancer Neg Hx   . Colon cancer Neg Hx   . Heart disease Neg Hx    Social History Social History  Substance Use Topics  . Smoking status: Former Smoker    Quit date: 03/12/1981  . Smokeless tobacco: Never Used  . Alcohol use Yes     Comment: occasional   Allergies   Codeine and Cortizone-5 [hydrocortisone base]  Review of Systems Review of Systems  Constitutional: Negative for fever.  Musculoskeletal: Positive for arthralgias and myalgias.  Neurological: Positive for numbness. Negative for weakness.  All other systems reviewed and are negative.  Physical Exam Updated Vital Signs BP (!) 141/74 (BP Location: Left Arm)   Pulse 60   Temp 98.7 F (37.1 C) (Oral)   Resp 16   Ht 5\' 10"  (1.778 m)   Wt 81.6  kg (180 lb)   SpO2 98%   BMI 25.83 kg/m  Vitals reviewed Physical Exam  Physical Examination: General appearance - alert, well appearing, and in no distress Mental status - alert, oriented to person, place, and time Eyes - no conjunctival injection, no scleral icterus Chest - clear to auscultation, no wheezes, rales or rhonchi, symmetric air entry Heart - normal rate, regular rhythm, normal S1, S2, no murmurs, rubs, clicks or gallops Neurological - alert, orientedx 3, sensation intact in bilateral upper extremities, grip  strength 5/5 in extremities x 4 Musculoskeletal - no joint tenderness, deformity or swelling, full flexion and extension of hand/fingers Extremities - peripheral pulses normal, no pedal edema, no clubbing or cyanosis Skin - normal coloration and turgor, no rashes  ED Treatments / Results  DIAGNOSTIC STUDIES: Oxygen Saturation is 98% on RA, normal by my interpretation.   COORDINATION OF CARE: 9:59 PM-Discussed next steps with pt. Pt verbalized understanding and is agreeable with the plan.   Labs (all labs ordered are listed, but only abnormal results are displayed) Labs Reviewed  BASIC METABOLIC PANEL - Abnormal; Notable for the following:       Result Value   Glucose, Bld 128 (*)    BUN 30 (*)    Creatinine, Ser 2.14 (*)    GFR calc non Af Amer 29 (*)    GFR calc Af Amer 33 (*)    All other components within normal limits    EKG  EKG Interpretation None      Radiology No results found.  Procedures Procedures   Medications Ordered in ED Medications - No data to display  Initial Impression / Assessment and Plan / ED Course  I have reviewed the triage vital signs and the nursing notes.  Pertinent labs & imaging results that were available during my care of the patient were reviewed by me and considered in my medical decision making (see chart for details).    Pt presenting with c/o hands cramping today.  More on left.  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  Today had cramping in left hand- both but worse in left.  Also reports having some numbness in first 3 fingers of hand.  No specific injury.  Suspect carpal tunnel.  Does have some renal insufficiency- worsened since chart baseline from 2015- d/w patient and he states he recently had physicial at the [facility] but doesn't know what his lab numbers showed- he states he will followup up with the [facility].  Discharged with strict return precautions.  Pt agreeable with  plan.   Final Clinical Impressions(s) / ED Diagnoses   Final diagnoses:  Hand or foot spasms  Renal insufficiency   New Prescriptions Discharge Medication List as of 03/03/2017 11:19 PM     I personally performed the services described in this documentation, which was scribed in my presence. The recorded information has been reviewed and is accurate.      Phillis HaggisMabe, Azael Ragain L, MD 03/04/17 [phone]

## 2017-03-03 NOTE — ED Triage Notes (Signed)
C/o bilat hand cramps x today-denies injury-NAD-steady gait

## 2017-03-03 NOTE — ED Notes (Signed)
Pt verbalizes understanding of d/c instructions and denies any further needs at this time. 

## 2017-11-28 ENCOUNTER — Encounter (HOSPITAL_BASED_OUTPATIENT_CLINIC_OR_DEPARTMENT_OTHER): Payer: Self-pay | Admitting: Emergency Medicine

## 2017-11-28 ENCOUNTER — Emergency Department (HOSPITAL_BASED_OUTPATIENT_CLINIC_OR_DEPARTMENT_OTHER)
Admission: EM | Admit: 2017-11-28 | Discharge: 2017-11-28 | Disposition: A | Discharge status: Home / Self Care | Payer: Medicare Other | Attending: Emergency Medicine | Admitting: Emergency Medicine

## 2017-11-28 ENCOUNTER — Other Ambulatory Visit: Payer: Self-pay

## 2017-11-28 ENCOUNTER — Emergency Department (HOSPITAL_BASED_OUTPATIENT_CLINIC_OR_DEPARTMENT_OTHER): Payer: Medicare Other

## 2017-11-28 DIAGNOSIS — Y939 Activity, unspecified: Secondary | ICD-10-CM | POA: Diagnosis not present

## 2017-11-28 DIAGNOSIS — S46911A Strain of unspecified muscle, fascia and tendon at shoulder and upper arm level, right arm, initial encounter: Secondary | ICD-10-CM | POA: Diagnosis not present

## 2017-11-28 DIAGNOSIS — W01190A Fall on same level from slipping, tripping and stumbling with subsequent striking against furniture, initial encounter: Secondary | ICD-10-CM | POA: Diagnosis not present

## 2017-11-28 DIAGNOSIS — Y999 Unspecified external cause status: Secondary | ICD-10-CM | POA: Diagnosis not present

## 2017-11-28 DIAGNOSIS — I10 Essential (primary) hypertension: Secondary | ICD-10-CM | POA: Diagnosis not present

## 2017-11-28 DIAGNOSIS — Y929 Unspecified place or not applicable: Secondary | ICD-10-CM | POA: Insufficient documentation

## 2017-11-28 DIAGNOSIS — Z87891 Personal history of nicotine dependence: Secondary | ICD-10-CM | POA: Diagnosis not present

## 2017-11-28 DIAGNOSIS — S4991XA Unspecified injury of right shoulder and upper arm, initial encounter: Secondary | ICD-10-CM | POA: Diagnosis present

## 2017-11-28 MED ORDER — DICLOFENAC SODIUM 1 % TD GEL: 2.0000 g | Freq: Four times a day (QID) | TRANSDERMAL | 0 refills | Status: DC

## 2017-11-28 MED ORDER — CYCLOBENZAPRINE HCL 10 MG PO TABS: 10.0000 mg | ORAL_TABLET | Freq: Two times a day (BID) | ORAL | 0 refills | Status: DC | PRN

## 2017-11-28 NOTE — ED Triage Notes (Signed)
R shoulder pain x 1 week after a fall.

## 2017-11-28 NOTE — ED Provider Notes (Signed)
[facility] EMERGENCY DEPARTMENT Provider Note   CSN: [id number] Arrival date & time: 11/28/17  1136     History   Chief Complaint Chief Complaint  Patient presents with  . Shoulder Pain    HPI Jonathan Obrien is a 77 y.o. male.  Patient is a 77 year old male with a history of hypertension, hyperlipidemia presenting today with right shoulder pain.  Patient states that occasionally his left leg will give out due to a past injury.  This happened approximately 1 week ago causing him to fall forward and hit his right shoulder on a dresser.  Since that time he has had pain in his shoulder but it has been gradually worsening.  Last night the pain was so bad that it was radiating down his deltoid into his elbow.  It even made his fingers feel numb and tingly.  He denies any shortness of breath, chest pain, cough or fever.  He has not noticed any swelling of the joints.  Not tried taking anything for this.  The history is provided by the patient.    Past Medical History:  Diagnosis Date  . Hypertension     Patient Active Problem List   Diagnosis Date Noted  . Arch pain of left foot 01/16/2014  . Plantar fasciitis of left foot 01/16/2014  . Deformity of metatarsal bone of left foot 01/16/2014  . Equinus deformity of foot 01/16/2014  . Bruit 12/20/2013  . Medicare annual wellness visit, subsequent 11/15/2013  . Prostate cancer screening 11/15/2013  . HTN (hypertension) 10/31/2013  . RBBB 10/26/2013  . Hyperlipidemia 05/02/2012  . Radicular pain of left lower extremity 05/02/2012  . Arthritis 04/06/2012  . Unspecified essential hypertension 09/17/2011    Past Surgical History:  Procedure Laterality Date  . ANAL FISSURE REPAIR    . COLONOSCOPY    . left leg surgery  1964        Home Medications    Prior to Admission medications   Medication Sig Start Date End Date Taking? Authorizing Provider  AMLODIPINE-ATORVASTATIN PO Take by mouth.    [provider]  cyclobenzaprine (FLEXERIL) 10 MG tablet Take 1 tablet (10 mg total) by mouth 2 (two) times daily as needed for muscle spasms. Take before you go to bed if you are having stiffness or tightening of the shoulder 11/28/17   Gwyneth Sprout, MD  diclofenac sodium (VOLTAREN) 1 % GEL Apply 2 g topically 4 (four) times daily. 11/28/17   Gwyneth Sprout, MD    Family History Family History  Problem Relation Age of Onset  . Diabetes Brother   . Prostate cancer Neg Hx   . Hypertension Neg Hx   . Breast cancer Neg Hx   . Colon cancer Neg Hx   . Heart disease Neg Hx     Social History Social History   Tobacco Use  . Smoking status: Former Smoker    Last attempt to quit: 03/12/1981    Years since quitting: 36.7  . Smokeless tobacco: Never Used  Substance Use Topics  . Alcohol use: Yes    Comment: occasional  . Drug use: Yes    Types: Marijuana     Allergies   Codeine and Cortizone-5 [hydrocortisone base]   Review of Systems Review of Systems  All other systems reviewed and are negative.    Physical Exam Updated Vital Signs BP (!) 167/71 (BP Location: Left Arm)   Pulse 63   Temp 98.2 F (36.8 C) (Oral)   Resp  18   Ht  (1.778 m)   Wt 81.6 kg (180 lb)   SpO2 98%   BMI 25.83 kg/m   Physical Exam  Constitutional: He is oriented to person, place, and time. He appears well-developed and well-nourished. No distress.  HENT:  Head: Normocephalic and atraumatic.  Mouth/Throat: Oropharynx is clear and moist.  Eyes: Pupils are equal, round, and reactive to light. Conjunctivae and EOM are normal.  Neck: Normal range of motion. Neck supple.  Cardiovascular: Normal rate and intact distal pulses.  Pulmonary/Chest: Effort normal.  Musculoskeletal: Normal range of motion. He exhibits tenderness. He exhibits no edema.       Right shoulder: He exhibits tenderness, pain and spasm. He exhibits normal range of motion, no bony tenderness, no swelling, normal pulse and normal  strength.       Arms: 2+ radial pulse on the right  Neurological: He is alert and oriented to person, place, and time.  Skin: Skin is warm and dry. No rash noted. No erythema.  Psychiatric: He has a normal mood and affect. His behavior is normal.  Nursing note and vitals reviewed.    ED Treatments / Results  Labs (all labs ordered are listed, but only abnormal results are displayed) Labs Reviewed - No data to display  EKG None  Radiology Dg Shoulder Right  Result Date: 11/28/2017 CLINICAL DATA:  Right shoulder pain for 1 week since a fall and blow to the shoulder. Initial encounter. EXAM: RIGHT SHOULDER - 2+ VIEW COMPARISON:  None. FINDINGS: There is no evidence of fracture or dislocation. There is no evidence of arthropathy or other focal bone abnormality. Soft tissues are unremarkable. IMPRESSION: Negative exam. Electronically Signed   By: Drusilla Kanner M.D.   On: 11/28/2017 12:05    Procedures Procedures (including critical care time)  Medications Ordered in ED Medications - No data to display   Initial Impression / Assessment and Plan / ED Course  I have reviewed the triage vital signs and the nursing notes.  Pertinent labs & imaging results that were available during my care of the patient were reviewed by me and considered in my medical decision making (see chart for details).     Patient with a mechanical fall 1 week ago with shoulder injury.  Continually to have gradually worsening shoulder pain which is now causing pain radiating down his right arm.  Occasional numbness and tingling in the fingers.  It was the worse last night.  No infectious symptoms.  No signs of septic joint.  X-ray is within normal limits.  Patient has palpable tight spasm muscles in the posterior shoulder area.  Vascularly intact.  Recommended Voltaren gel, muscle relaxers and therapeutic massage.  Given follow-up with Dr. Pearletha Forge of these things do not work.  Final Clinical Impressions(s) /  ED Diagnoses   Final diagnoses:  Strain of right shoulder, initial encounter    ED Discharge Orders        Ordered    diclofenac sodium (VOLTAREN) 1 % GEL  4 times daily     11/28/17 1237    cyclobenzaprine (FLEXERIL) 10 MG tablet  2 times daily PRN     11/28/17 1237       Gwyneth Sprout, MD 11/28/17 1255

## 2017-11-28 NOTE — Discharge Instructions (Signed)
Try therapuetic massage and also stretching exercises at home

## 2019-09-14 ENCOUNTER — Ambulatory Visit: Payer: Self-pay | Attending: Internal Medicine

## 2019-09-14 DIAGNOSIS — Z23 Encounter for immunization: Secondary | ICD-10-CM | POA: Insufficient documentation

## 2019-09-14 NOTE — Progress Notes (Signed)
   Covid-19 Vaccination Clinic  Name:  [name]    MRN: [id number] DOB: [birth year]  09/14/2019  [name] was observed post Covid-19 immunization for 15 minutes without incidence. He was provided with Vaccine Information Sheet and instruction to access the V-Safe system.   [name] was instructed to call 911 with any severe reactions post vaccine: Marland Kitchen Difficulty breathing  . Swelling of your face and throat  . A fast heartbeat  . A bad rash all over your body  . Dizziness and weakness    Immunizations Administered    Name Date Dose VIS Date Route   Pfizer COVID-19 Vaccine 09/14/2019 11:45 AM 0.3 mL 06/30/2019 Intramuscular   Manufacturer: [employer], [employer]   Lot: [id number]   NDC: [address]-1

## 2019-10-10 ENCOUNTER — Ambulatory Visit: Payer: Self-pay | Attending: Internal Medicine

## 2019-10-10 DIAGNOSIS — Z23 Encounter for immunization: Secondary | ICD-10-CM

## 2019-10-10 NOTE — Progress Notes (Signed)
   Covid-19 Vaccination Clinic  Name:  Jonathan Obrien    MRN: [id number] DOB: Jun 24, 1941  10/10/2019  Mr. Jonathan Obrien was observed post Covid-19 immunization for 15 minutes without incident. He was provided with Vaccine Information Sheet and instruction to access the V-Safe system.   Mr. Jonathan Obrien was instructed to call 911 with any severe reactions post vaccine: Marland Kitchen Difficulty breathing  . Swelling of face and throat  . A fast heartbeat  . A bad rash all over body  . Dizziness and weakness   Immunizations Administered    Name Date Dose VIS Date Route   Pfizer COVID-19 Vaccine 10/10/2019  9:29 AM 0.3 mL 06/30/2019 Intramuscular   Manufacturer: [employer], [employer]   Lot: [id number]   NDC: [address]-4

## 2020-05-18 ENCOUNTER — Ambulatory Visit: Payer: Medicare Other | Attending: Internal Medicine

## 2020-05-18 DIAGNOSIS — Z23 Encounter for immunization: Secondary | ICD-10-CM

## 2020-05-18 NOTE — Progress Notes (Signed)
   Covid-19 Vaccination Clinic  Name:  HARUKI ARNOLD    MRN: [id number] DOB: 06-29-1941  05/18/2020  Mr. Ikner was observed post Covid-19 immunization for 15 minutes without incident. He was provided with Vaccine Information Sheet and instruction to access the V-Safe system.   Mr. Marcott was instructed to call 911 with any severe reactions post vaccine: Marland Kitchen Difficulty breathing  . Swelling of face and throat  . A fast heartbeat  . A bad rash all over body  . Dizziness and weakness

## 2021-02-18 ENCOUNTER — Other Ambulatory Visit: Payer: Self-pay | Admitting: Neurological Surgery

## 2021-02-18 DIAGNOSIS — M5412 Radiculopathy, cervical region: Secondary | ICD-10-CM

## 2021-02-23 ENCOUNTER — Other Ambulatory Visit: Payer: Self-pay

## 2021-02-23 ENCOUNTER — Ambulatory Visit
Admission: RE | Admit: 2021-02-23 | Discharge: 2021-02-23 | Disposition: A | Discharge status: Home / Self Care | Payer: Self-pay | Source: Ambulatory Visit | Attending: Neurological Surgery | Admitting: Neurological Surgery

## 2021-02-23 DIAGNOSIS — M5412 Radiculopathy, cervical region: Secondary | ICD-10-CM

## 2021-02-28 ENCOUNTER — Other Ambulatory Visit: Payer: Self-pay

## 2021-02-28 ENCOUNTER — Ambulatory Visit
Admission: RE | Admit: 2021-02-28 | Discharge: 2021-02-28 | Disposition: A | Discharge status: Home / Self Care | Payer: No Typology Code available for payment source | Source: Ambulatory Visit | Attending: Neurological Surgery | Admitting: Neurological Surgery

## 2021-03-04 ENCOUNTER — Other Ambulatory Visit: Payer: Self-pay

## 2022-03-19 ENCOUNTER — Ambulatory Visit (INDEPENDENT_AMBULATORY_CARE_PROVIDER_SITE_OTHER): Payer: No Typology Code available for payment source | Admitting: Family Medicine

## 2022-03-19 ENCOUNTER — Encounter: Payer: Self-pay | Admitting: Family Medicine

## 2022-03-19 VITALS — BP 150/80 | Ht 70.0 in | Wt 185.0 lb

## 2022-03-19 DIAGNOSIS — M47812 Spondylosis without myelopathy or radiculopathy, cervical region: Secondary | ICD-10-CM | POA: Diagnosis not present

## 2022-03-19 DIAGNOSIS — M4802 Spinal stenosis, cervical region: Secondary | ICD-10-CM | POA: Diagnosis not present

## 2022-03-19 MED ORDER — METHYLPREDNISOLONE ACETATE 40 MG/ML IJ SUSP
40.0000 mg | Freq: Once | INTRAMUSCULAR | Status: AC
  Administered 2022-03-19: 40 mg via INTRAMUSCULAR

## 2022-03-19 MED ORDER — KETOROLAC TROMETHAMINE 30 MG/ML IJ SOLN
30.0000 mg | Freq: Once | INTRAMUSCULAR | Status: AC
  Administered 2022-03-19: 30 mg via INTRAMUSCULAR

## 2022-03-19 NOTE — Patient Instructions (Addendum)
Nice to meet you Please try to avoid anti-inflammatories like ibuprofen and naproxen or aleve. You can use these as needed but they can hurt your kidneys.  Please use tylenol   Please use heat  Please try the exercises  Please try the soft collar  [facility] about SAMsport or ZYnex  Please send me a message in MyChart with any questions or updates.  Please see me back in 3-4 weeks.   --Dr. Jordan Likes

## 2022-03-19 NOTE — Assessment & Plan Note (Signed)
Acute on chronic in nature.  Pain today seems more associated with the facet joints but he does have pain bilaterally at times. -Counseled on home exercise therapy and supportive care. -Could consider epidural

## 2022-03-19 NOTE — Progress Notes (Signed)
  Jonathan Obrien - 81 y.o. male MRN [id number]  Date of birth: September 25, 1940  SUBJECTIVE:  Including CC & ROS.  No chief complaint on file.   Jonathan Obrien is a 81 y.o. male that is presenting with acute on chronic neck pain.  The pain is on the left side of his neck..  Review of the office visit from 8/22 shows that his renal function is reduced. Independent review of the MRI of the cervical spine from 2022 shows spinal stenosis at C3-4 as well as degenerative changes at C6/7 with facet arthrosis at C4-5 and C5-6.  Review of Systems See HPI   HISTORY: Past Medical, Surgical, Social, and Family History Reviewed & Updated per EMR.   Pertinent Historical Findings include:  Past Medical History:  Diagnosis Date   Hypertension     Past Surgical History:  Procedure Laterality Date   ANAL FISSURE REPAIR     COLONOSCOPY     left leg surgery  1964     PHYSICAL EXAM:  VS: BP (!) 150/80 (BP Location: Left Arm, Patient Position: Sitting)   Ht 5\' 10"  (1.778 m)   Wt 185 lb (83.9 kg)   BMI 26.54 kg/m  Physical Exam Gen: NAD, alert, cooperative with exam, well-appearing MSK:  Neurovascularly intact       ASSESSMENT & PLAN:   Facet arthropathy, cervical Acute on nature.  Having multiple levels of degenerative changes in the facet joints.  Has tried facet injections in the past. -Counseled on home exercise therapy and supportive care. -Cervical soft collar. -Counseled on ibuprofen and naproxen. -IM Depo-Medrol and Toradol. -Counseled on sam sport and zynex  -Could consider physical therapy or injections.  Spinal stenosis in cervical region Acute on chronic in nature.  Pain today seems more associated with the facet joints but he does have pain bilaterally at times. -Counseled on home exercise therapy and supportive care. -Could consider epidural

## 2022-03-19 NOTE — Assessment & Plan Note (Addendum)
Acute on nature.  Having multiple levels of degenerative changes in the facet joints.  Has tried facet injections in the past. -Counseled on home exercise therapy and supportive care. -Cervical soft collar. -Counseled on ibuprofen and naproxen. -IM Depo-Medrol and Toradol. -Counseled on sam sport and zynex  -Could consider physical therapy or injections.

## 2022-04-09 ENCOUNTER — Ambulatory Visit (INDEPENDENT_AMBULATORY_CARE_PROVIDER_SITE_OTHER): Payer: No Typology Code available for payment source | Admitting: Family Medicine

## 2022-04-09 ENCOUNTER — Encounter: Payer: Self-pay | Admitting: Family Medicine

## 2022-04-09 VITALS — BP 166/90 | Ht 70.0 in | Wt 185.0 lb

## 2022-04-09 DIAGNOSIS — M47812 Spondylosis without myelopathy or radiculopathy, cervical region: Secondary | ICD-10-CM

## 2022-04-09 NOTE — Progress Notes (Signed)
  Jonathan Obrien - 81 y.o. male MRN [id number]  Date of birth: 07/08/1941  SUBJECTIVE:  Including CC & ROS.  No chief complaint on file.   Jonathan Obrien is a 81 y.o. male that is following up for his neck pain.  He has soreness and stiffness but it is mild in nature.  He seems to improve as he stays active.   Review of Systems See HPI   HISTORY: Past Medical, Surgical, Social, and Family History Reviewed & Updated per EMR.   Pertinent Historical Findings include:  Past Medical History:  Diagnosis Date   Hypertension     Past Surgical History:  Procedure Laterality Date   ANAL FISSURE REPAIR     COLONOSCOPY     left leg surgery  1964     PHYSICAL EXAM:  VS: BP (!) 166/90 (BP Location: Left Arm, Patient Position: Sitting)   Ht 5\' 10"  (1.778 m)   Wt 185 lb (83.9 kg)   BMI 26.54 kg/m  Physical Exam Gen: NAD, alert, cooperative with exam, well-appearing MSK:  Neurovascularly intact       ASSESSMENT & PLAN:   Facet arthropathy, cervical Pain is intermittent in nature.  He does have stiffness.  He has tried facet injections in the past with limited improvement. -Counseled on home exercise therapy and supportive care. -Counseled on SAMsports or zynex  -Could consider shockwave therapy or physical therapy.

## 2022-04-09 NOTE — Assessment & Plan Note (Signed)
Pain is intermittent in nature.  He does have stiffness.  He has tried facet injections in the past with limited improvement. -Counseled on home exercise therapy and supportive care. -Counseled on SAMsports or zynex  -Could consider shockwave therapy or physical therapy.

## 2022-04-09 NOTE — Patient Instructions (Signed)
Good to see you Please try voltaren topical over the counter on the neck  [facility] about SAMsport and Zynex   Please send me a message in MyChart with any questions or updates.  Please see me back as needed.   --Dr. Raeford Razor

## 2022-04-16 ENCOUNTER — Other Ambulatory Visit: Payer: Self-pay | Admitting: Physician Assistant

## 2022-04-16 DIAGNOSIS — M4802 Spinal stenosis, cervical region: Secondary | ICD-10-CM

## 2022-04-28 ENCOUNTER — Encounter
Payer: No Typology Code available for payment source | Attending: Physical Medicine & Rehabilitation | Admitting: Physical Medicine & Rehabilitation

## 2022-04-28 ENCOUNTER — Encounter: Payer: Self-pay | Admitting: Physical Medicine & Rehabilitation

## 2022-05-03 ENCOUNTER — Ambulatory Visit
Admission: RE | Admit: 2022-05-03 | Discharge: 2022-05-03 | Disposition: A | Discharge status: Home / Self Care | Payer: No Typology Code available for payment source | Source: Ambulatory Visit | Attending: Physician Assistant | Admitting: Physician Assistant

## 2022-05-03 DIAGNOSIS — M4802 Spinal stenosis, cervical region: Secondary | ICD-10-CM

## 2022-06-30 ENCOUNTER — Encounter
Payer: No Typology Code available for payment source | Attending: Physical Medicine & Rehabilitation | Admitting: Physical Medicine & Rehabilitation

## 2022-06-30 ENCOUNTER — Encounter: Payer: Self-pay | Admitting: Physical Medicine & Rehabilitation

## 2022-06-30 VITALS — BP 184/75 | HR 57 | Ht 70.0 in | Wt 187.0 lb

## 2022-06-30 DIAGNOSIS — R200 Anesthesia of skin: Secondary | ICD-10-CM | POA: Diagnosis not present

## 2022-06-30 NOTE — Progress Notes (Signed)
Pt referred for EMG/NCV by Dr Dawley in bilateral upper extremities to evaluate bilateral hand numbness  Please see report filed under media tab.  Briefly 81 year old male with history of cervical spinal stenosis.  He has bilateral hand numbness worse at night.  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  Normal bilateral upper extremity needle EMG showing no electrodiagnostic evidence of cervical radiculopathy  Patient will follow-up with Dr. Jake Samples to discuss results

## 2022-07-03 ENCOUNTER — Encounter: Payer: Self-pay | Admitting: Physical Medicine & Rehabilitation

## 2022-08-10 ENCOUNTER — Telehealth: Payer: Self-pay | Admitting: Physical Medicine & Rehabilitation

## 2022-08-10 NOTE — Telephone Encounter (Signed)
Jonathan Obrien came in today complaining of pain in his neck. He said its been like that since he had the EMG on December 12th.

## 2022-08-19 NOTE — Telephone Encounter (Signed)
Called patient to advise he needs to follow up w/ his neurosurgeon

## 2022-10-31 ENCOUNTER — Encounter (HOSPITAL_BASED_OUTPATIENT_CLINIC_OR_DEPARTMENT_OTHER): Payer: Self-pay | Admitting: Emergency Medicine

## 2022-10-31 ENCOUNTER — Emergency Department (HOSPITAL_BASED_OUTPATIENT_CLINIC_OR_DEPARTMENT_OTHER)
Admission: EM | Admit: 2022-10-31 | Discharge: 2022-10-31 | Discharge status: Against Medical Advice | Payer: No Typology Code available for payment source | Attending: Emergency Medicine | Admitting: Emergency Medicine

## 2022-10-31 DIAGNOSIS — I10 Essential (primary) hypertension: Secondary | ICD-10-CM | POA: Insufficient documentation

## 2022-10-31 DIAGNOSIS — Z5321 Procedure and treatment not carried out due to patient leaving prior to being seen by health care provider: Secondary | ICD-10-CM | POA: Diagnosis not present

## 2022-10-31 NOTE — ED Triage Notes (Signed)
Pt states took Blood pressure it was "over 200 and something" states had gotten a shot in back yesterday, not sure what for. Denies other Sx, wife made him come.

## 2022-11-03 ENCOUNTER — Encounter: Payer: Self-pay | Admitting: *Deleted

## 2023-03-30 IMAGING — MR MR CERVICAL SPINE W/O CM
4 of 5 series · 27 of 48 positions shown · non-contrast
Comparison: Outside cervical spine MRI dated 12/20/2019

CLINICAL DATA: Cervical radiculopathy. Chronic neck pain extending
into both shoulders. Numbness in the fingers of both hands.

EXAM:
MRI CERVICAL SPINE WITHOUT CONTRAST
TECHNIQUE: Multiplanar, multisequence MR imaging of the cervical spine was
performed. No intravenous contrast was administered.

[Series 5: T2 · sagittal · 3.0mm · 0.55mm/px · 6 of 13 slices shown (1 of 2)]
[im 1/13]
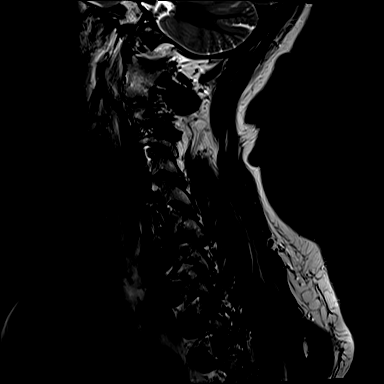
[im 3/13]
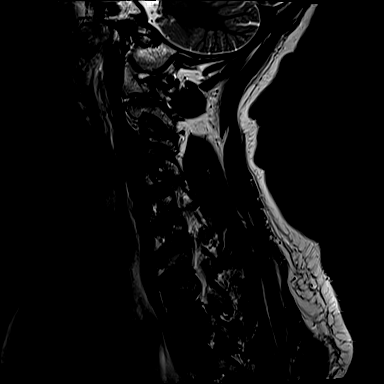
[im 5/13]
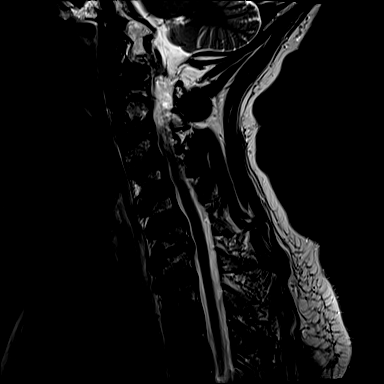
[im 8/13]
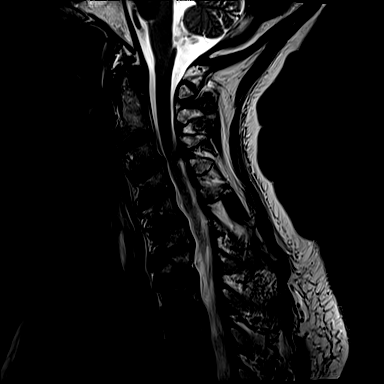
[im 10/13]
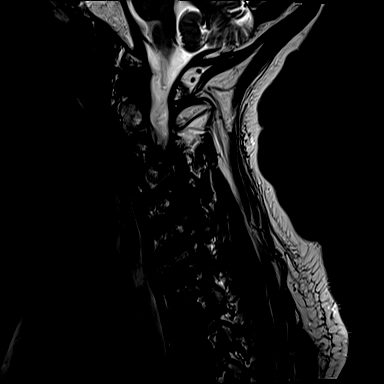
[im 13/13]
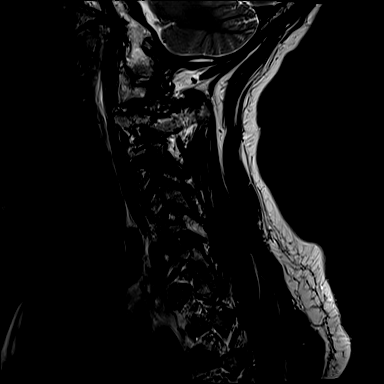

[Series 6: T1 · sagittal · 3.0mm · 0.66mm/px · 7 of 13 slices shown]
[im 1/13]
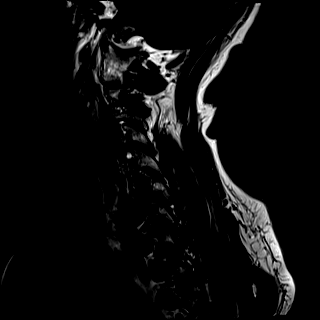
[im 3/13]
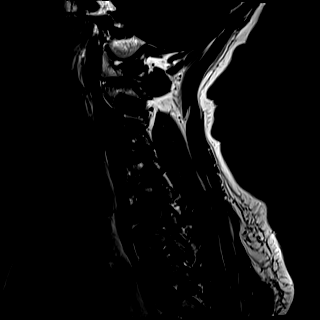
[im 5/13]
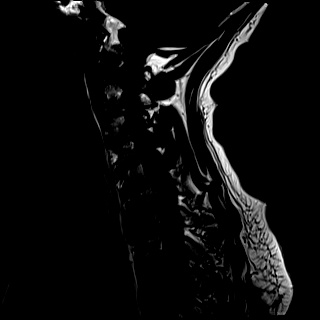
[im 7/13]
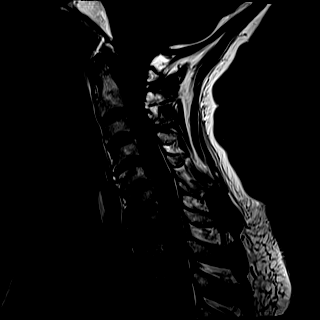
[im 9/13]
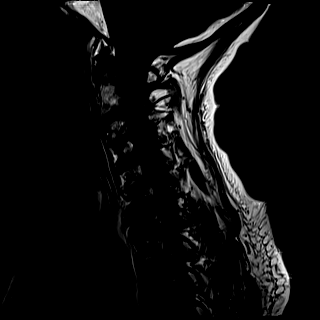
[im 11/13]
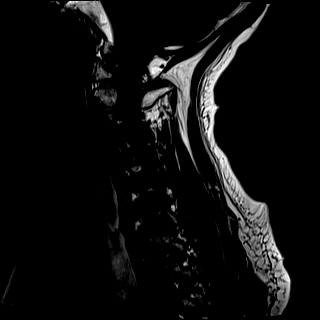
[im 13/13]
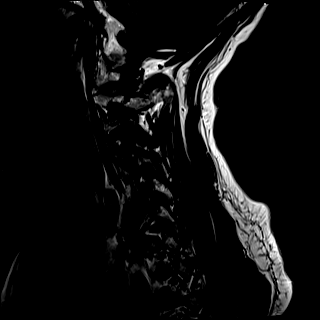

[Series 7: STIR · sagittal · 3.0mm · 0.33mm/px · 6 of 13 slices shown]
[im 1/13]
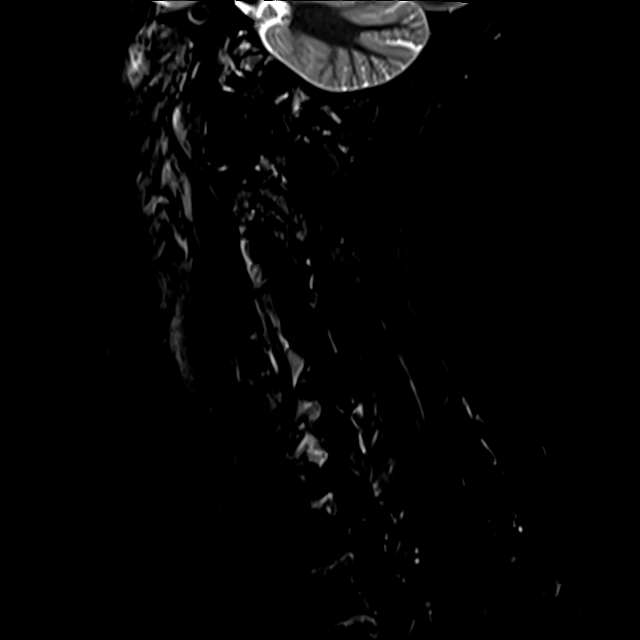
[im 3/13]
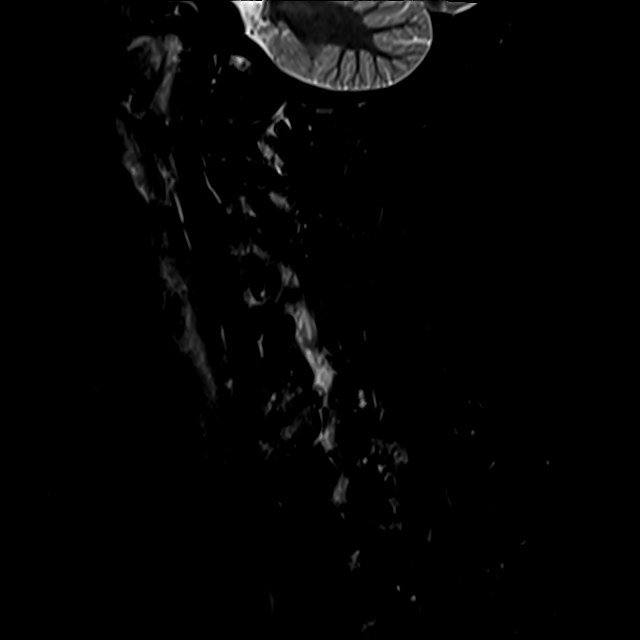
[im 5/13]
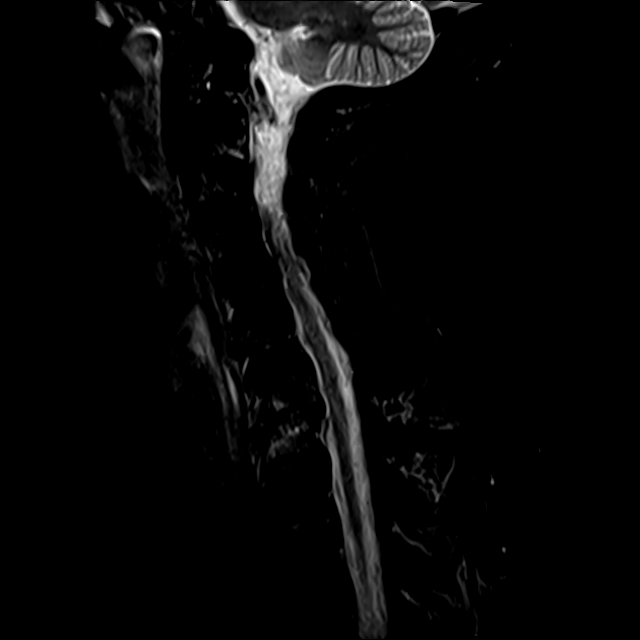
[im 7/13]
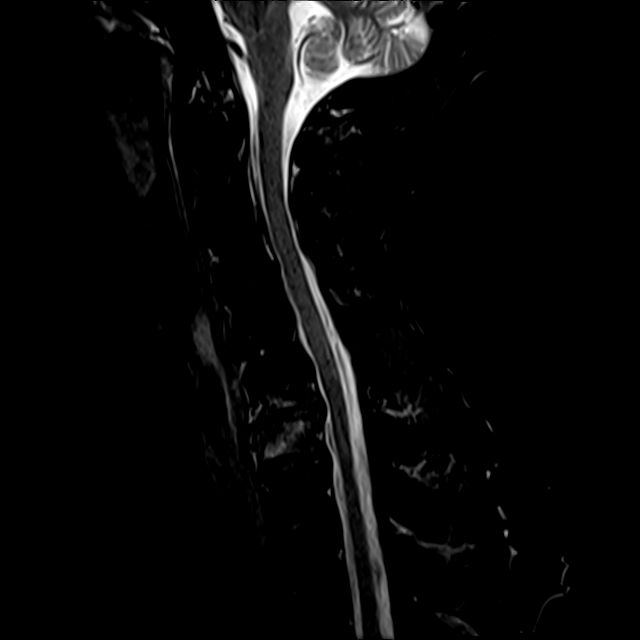
[im 9/13]
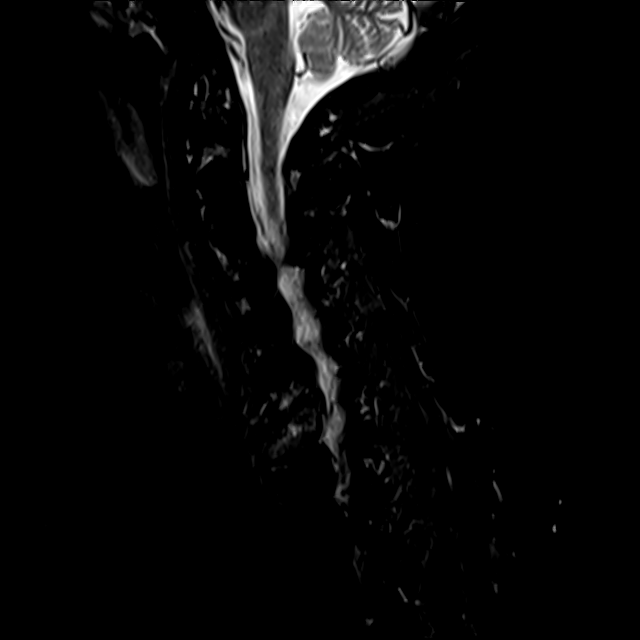
[im 11/13]
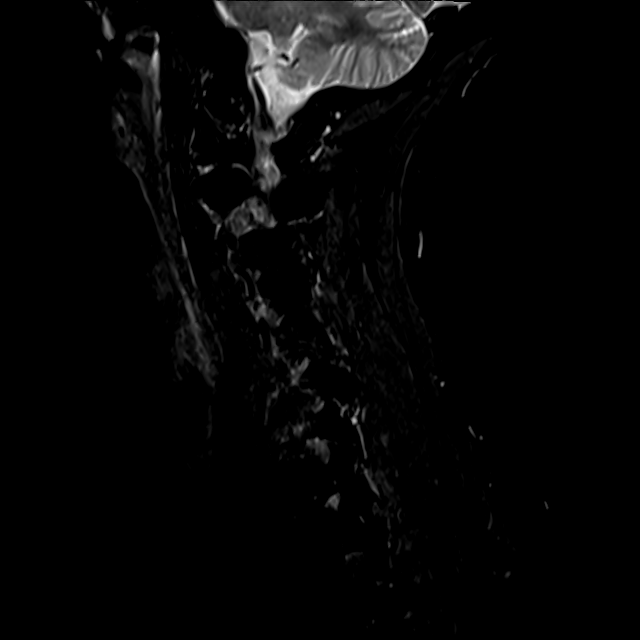

[Series 8: T2 · axial · 3.0mm · 0.50mm/px · z∈[-34,+68]mm · 8 of 28 slices shown (2 of 2)]
[im 1/28]
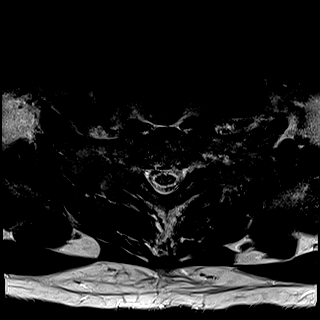
[im 5/28]
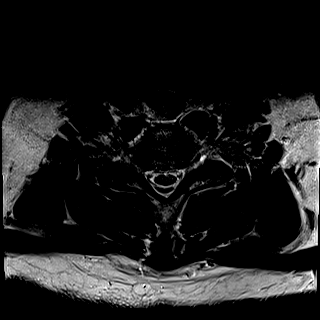
[im 9/28]
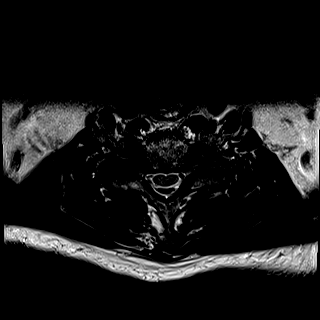
[im 13/28]
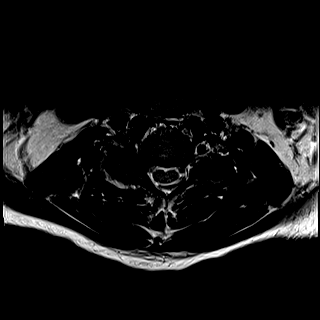
[im 15/28]
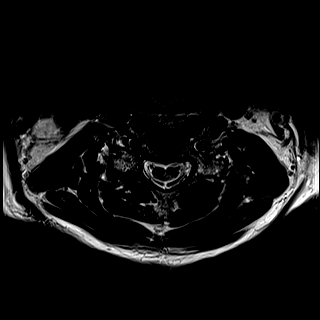
[im 19/28]
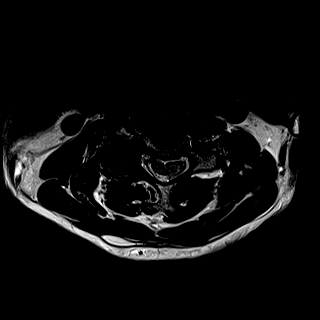
[im 23/28]
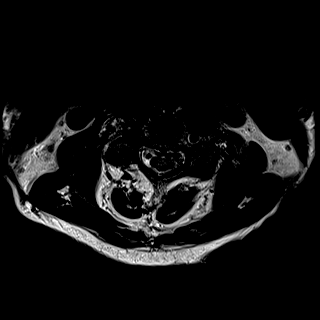
[im 28/28]
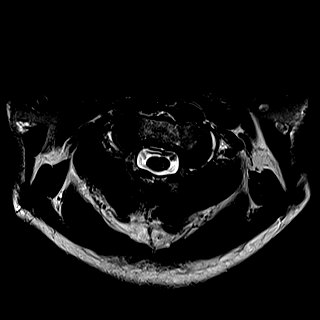

[27 of 48 positions shown; findings below may reference images not displayed]

FINDINGS: Alignment: Mild chronic reversal of the normal cervical lordosis.
Unchanged trace anterolisthesis of C5 on C6.

Vertebrae: No fracture or suspicious marrow lesion. Increased
degenerative endplate edema at C6-7. Increased facet edema on the
right at C5-6 and on the left at C2-3.

Cord: Normal signal and morphology.

Posterior Fossa, vertebral arteries, paraspinal tissues:
Unremarkable.

Disc levels:

C2-3: Uncovertebral spurring and moderate to severe left facet
arthrosis result in moderate left neural foraminal stenosis without
spinal stenosis, unchanged.

C3-4: Disc bulging, uncovertebral spurring, and severe right facet
arthrosis result in mild spinal stenosis and severe right and mild
left neural foraminal stenosis, unchanged.

C4-5: Mild disc bulging, uncovertebral spurring, and moderate right
and mild left facet arthrosis result in moderate right and mild left
neural foraminal stenosis without spinal stenosis, unchanged.

C5-6: Anterolisthesis with disc uncovering, uncovertebral spurring,
and severe right and mild left facet arthrosis result in severe
right neural foraminal stenosis without spinal stenosis, unchanged.

C6-7: Moderate disc space narrowing. A broad-based posterior disc
osteophyte complex and mild facet arthrosis result in moderate right
greater than left neural foraminal stenosis without spinal stenosis,
unchanged.

C7-T1: Mild disc bulging and mild facet arthrosis result in mild
right neural foraminal stenosis, stable to slightly increased. No
spinal stenosis.
IMPRESSION: 1. Increased degenerative endplate edema at C6-7 and facet edema on
the left at C2-3 and right at C5-6.
2. Unchanged mild spinal stenosis at C3-4.
3. Unchanged moderate to severe multilevel neural foraminal stenosis
as above.

## 2023-04-20 ENCOUNTER — Other Ambulatory Visit (HOSPITAL_BASED_OUTPATIENT_CLINIC_OR_DEPARTMENT_OTHER): Payer: Self-pay | Admitting: Neurosurgery

## 2023-04-20 DIAGNOSIS — M5412 Radiculopathy, cervical region: Secondary | ICD-10-CM

## 2023-05-05 ENCOUNTER — Ambulatory Visit (HOSPITAL_BASED_OUTPATIENT_CLINIC_OR_DEPARTMENT_OTHER): Payer: No Typology Code available for payment source

## 2023-05-09 ENCOUNTER — Other Ambulatory Visit (HOSPITAL_BASED_OUTPATIENT_CLINIC_OR_DEPARTMENT_OTHER): Payer: No Typology Code available for payment source

## 2023-05-16 ENCOUNTER — Ambulatory Visit (HOSPITAL_BASED_OUTPATIENT_CLINIC_OR_DEPARTMENT_OTHER)
Admission: RE | Admit: 2023-05-16 | Discharge: 2023-05-16 | Disposition: A | Discharge status: Home / Self Care | Payer: No Typology Code available for payment source | Source: Ambulatory Visit | Attending: Neurosurgery | Admitting: Neurosurgery

## 2023-05-16 DIAGNOSIS — M5412 Radiculopathy, cervical region: Secondary | ICD-10-CM | POA: Diagnosis present

## 2023-06-09 ENCOUNTER — Emergency Department (HOSPITAL_COMMUNITY): Payer: No Typology Code available for payment source

## 2023-06-09 ENCOUNTER — Emergency Department (HOSPITAL_COMMUNITY)
Admission: EM | Admit: 2023-06-09 | Discharge: 2023-06-09 | Disposition: A | Discharge status: Home / Self Care | Payer: No Typology Code available for payment source | Attending: Emergency Medicine | Admitting: Emergency Medicine

## 2023-06-09 DIAGNOSIS — R55 Syncope and collapse: Secondary | ICD-10-CM | POA: Insufficient documentation

## 2023-06-09 DIAGNOSIS — I10 Essential (primary) hypertension: Secondary | ICD-10-CM | POA: Insufficient documentation

## 2023-06-09 DIAGNOSIS — R059 Cough, unspecified: Secondary | ICD-10-CM | POA: Insufficient documentation

## 2023-06-09 DIAGNOSIS — R051 Acute cough: Secondary | ICD-10-CM

## 2023-06-09 DIAGNOSIS — J069 Acute upper respiratory infection, unspecified: Secondary | ICD-10-CM

## 2023-06-09 LAB — CBC
HCT: 43.8 % (ref 39.0–52.0)
Hemoglobin: 13.5 g/dL (ref 13.0–17.0)
MCH: 23.2 pg — ABNORMAL LOW (ref 26.0–34.0)
MCHC: 30.8 g/dL (ref 30.0–36.0)
MCV: 75.4 fL — ABNORMAL LOW (ref 80.0–100.0)
Platelets: 218 10*3/uL (ref 150–400)
RBC: 5.81 MIL/uL (ref 4.22–5.81)
RDW: 16.2 % — ABNORMAL HIGH (ref 11.5–15.5)
WBC: 6.1 10*3/uL (ref 4.0–10.5)
nRBC: 0 % (ref 0.0–0.2)

## 2023-06-09 LAB — BASIC METABOLIC PANEL
Anion gap: 7 (ref 5–15)
BUN: 15 mg/dL (ref 8–23)
CO2: 25 mmol/L (ref 22–32)
Calcium: 9.1 mg/dL (ref 8.9–10.3)
Chloride: 107 mmol/L (ref 98–111)
Creatinine, Ser: 1.33 mg/dL — ABNORMAL HIGH (ref 0.61–1.24)
GFR, Estimated: 53 mL/min — ABNORMAL LOW (ref >60)
Glucose, Bld: 102 mg/dL — ABNORMAL HIGH (ref 70–99)
Potassium: 4.6 mmol/L (ref 3.5–5.1)
Sodium: 139 mmol/L (ref 135–145)

## 2023-06-09 LAB — APTT: aPTT: 22 s — ABNORMAL LOW (ref 24–36)

## 2023-06-09 LAB — DIFFERENTIAL
Abs Immature Granulocytes: 0.01 10*3/uL (ref 0.00–0.07)
Basophils Absolute: 0 10*3/uL (ref 0.0–0.1)
Basophils Relative: 1 %
Eosinophils Absolute: 0.1 10*3/uL (ref 0.0–0.5)
Eosinophils Relative: 1 %
Immature Granulocytes: 0 %
Lymphocytes Relative: 29 %
Lymphs Abs: 1.8 10*3/uL (ref 0.7–4.0)
Monocytes Absolute: 0.7 10*3/uL (ref 0.1–1.0)
Monocytes Relative: 12 %
Neutro Abs: 3.5 10*3/uL (ref 1.7–7.7)
Neutrophils Relative %: 57 %

## 2023-06-09 LAB — PROTIME-INR
INR: 1 (ref 0.8–1.2)
Prothrombin Time: 13.1 s (ref 11.4–15.2)

## 2023-06-09 MED ORDER — ACETAMINOPHEN 500 MG PO TABS
1000.0000 mg | ORAL_TABLET | Freq: Once | ORAL | Status: AC
  Administered 2023-06-09: 1000 mg via ORAL
  Filled 2023-06-09: qty 2

## 2023-06-09 NOTE — ED Notes (Signed)
Patient transported to X-ray 

## 2023-06-09 NOTE — ED Notes (Signed)
The pt wants to know what is going on with him  pa   is going in to see him

## 2023-06-09 NOTE — ED Provider Notes (Signed)
[place] EMERGENCY DEPARTMENT AT [facility] Provider Note   CSN: [id number] Arrival date & time: 06/09/23  1356     History  Chief Complaint  Patient presents with   Loss of Consciousness    Jonathan Obrien is a 82 y.o. male with PMHx HTN who presents to ED concered for syncopal episode today. Patient stating that he does not remember passing out, but supposedly patient's wife told EMS this.  Patient stating that he has had a productive cough x2-3 days. Patient was having a coughing fit when he lost consciousness. Patient denies any other prodromal symptoms such as headache or chest pain. Patient stating that he has chronic neck pain from skydiving when he was young - he states that neck pain is exacerbated by his coughing fits. Upon further questioning, patient was more concerned about his cough and associated malaise for the past 2-3 days.  For some reason patient was on 2L O2 [place] with EMS, but patient currently with O2 >96% on RA during initial interview.  Denies dizziness, headache, head trauma, seizure, blood thinners. Denies fever, dyspnea, nausea, vomiting, diarrhea, abdominal pain.    Loss of Consciousness      Home Medications Prior to Admission medications   Medication Sig Start Date End Date Taking? Authorizing Provider  AMLODIPINE-ATORVASTATIN PO Take by mouth.    [provider]  cyclobenzaprine (FLEXERIL) 10 MG tablet Take 1 tablet (10 mg total) by mouth 2 (two) times daily as needed for muscle spasms. Take before you go to bed if you are having stiffness or tightening of the shoulder 11/28/17   Gwyneth Sprout, MD  diclofenac sodium (VOLTAREN) 1 % GEL Apply 2 g topically 4 (four) times daily. 11/28/17   Gwyneth Sprout, MD      Allergies    Codeine and Cortizone-5 [hydrocortisone base]    Review of Systems   Review of Systems  Cardiovascular:  Positive for syncope.    Physical Exam Updated Vital Signs BP (!) 150/99 (BP Location: Left  Arm)   Pulse (!) 47   Temp 98 F (36.7 C) (Oral)   Resp 15   SpO2 97%  Physical Exam Vitals and nursing note reviewed.  Constitutional:      General: He is not in acute distress.    Appearance: He is not ill-appearing or toxic-appearing.  HENT:     Head: Normocephalic and atraumatic.     Mouth/Throat:     Mouth: Mucous membranes are moist.  Eyes:     General: No scleral icterus.       Right eye: No discharge.        Left eye: No discharge.     Conjunctiva/sclera: Conjunctivae normal.  Cardiovascular:     Rate and Rhythm: Normal rate and regular rhythm.     Pulses: Normal pulses.     Heart sounds: Normal heart sounds. No murmur heard. Pulmonary:     Effort: Pulmonary effort is normal. No respiratory distress.     Breath sounds: Normal breath sounds. No wheezing, rhonchi or rales.  Abdominal:     General: Abdomen is flat. Bowel sounds are normal. There is no distension.     Palpations: Abdomen is soft. There is no mass.     Tenderness: There is no abdominal tenderness.  Musculoskeletal:     Right lower leg: No edema.     Left lower leg: No edema.  Skin:    General: Skin is warm and dry.     Findings: No  rash.  Neurological:     General: No focal deficit present.     Mental Status: He is alert. Mental status is at baseline.     Comments: GCS 15. Speech is goal oriented. No deficits appreciated to CN III-XII; symmetric eyebrow raise, no facial drooping, tongue midline. Patient has equal grip strength bilaterally with 5/5 strength against resistance in all major muscle groups bilaterally. Sensation to light touch intact. Patient moves extremities without ataxia. Normal finger-nose-finger. Patient ambulatory with steady gait.   Psychiatric:        Mood and Affect: Mood normal.        Behavior: Behavior normal.     ED Results / Procedures / Treatments   Labs (all labs ordered are listed, but only abnormal results are displayed) Labs Reviewed - No data to  display  EKG None  Radiology No results found.  Procedures Procedures    Medications Ordered in ED Medications - No data to display  ED Course/ Medical Decision Making/ A&P                                 Medical Decision Making Amount and/or Complexity of Data Reviewed Labs: ordered. Radiology: ordered.  Risk OTC drugs.   This patient presents to the ED for concern of syncope, this involves an extensive number of treatment options, and is a complaint that carries with it a high risk of complications and morbidity.  The differential diagnosis includes CVA, ICH, intracranial mass, critical dehydration, endocrine abnormality, sepsis/infection, electrolyte abnormality, cardiac arrhythmia.   Co morbidities that complicate the patient evaluation  HTN   Additional [facility] obtained[facility]   Lab Tests:  I Ordered, and personally interpreted labs.  The pertinent results include:   -CBC: No concern for anemia or leukocytosis -BMP: mild elevation of Cr at 1.33 -APTT: mildly low at 22; PT-INR within normal limits   Imaging Studies ordered:  I ordered imaging studies including  -CT cervical spine/head: To assess for process contributing to patient's syncopal episode -chest xray: To assess for process contributing to patient's cough I independently visualized and interpreted imaging  I agree with the radiologist interpretation   Cardiac Monitoring: / EKG:  The patient was maintained on a cardiac monitor.  I personally viewed and interpreted the cardiac monitored which showed an underlying rhythm of: no acute changes when compared to past EKG   Problem List / ED Course / Critical interventions / Medication management  Patient presents to ED for syncopal episode. This history is confusing because patient now denying syncope and it seems like maybe his wife told EMS that patient loss consciousness after coughing. Patient stating that he was having  a coughing fit when he "lost consciousness". Patient stating that he was laying in bed - denies head trauma, seizures, blood thinners.  Patient stating that he was mostly concerned about his recent cough and malaise x2-3 days. States that Advil has been providing relief. Denying any other infectious symptoms today.  Patient afebrile with stable vitals.  Physical/neuro exam unremarkable. CBC without leukocytosis or anemia.  aPTT is mildly low at 22 -PT/INR within normal limits. BMP with mildly elevated Cr at 1.33 - patient stating he has been taking Advil recently. I educated patient to stay hydrated and minimize his Advil usage. Also recommended following up with PCP. Patient verbalized understanding of plan. CT cervical spine/head without acute abnormalities. Chest xray without acute  cardiopulmonary disease. Shared results with patient.  Patient stating that he would like to go home.  It appears that patient's coughing and malaise symptoms are due to a viral URI.  Patient stating that his symptoms are getting better once he takes Advil.  Educated patient on alternating ibuprofen and Tylenol for symptom management. Staffed with Dr. Andria Meuse I have reviewed the patients home medicines and have made adjustments as needed Patient was given return precautions. Patient stable for discharge at this time.  Patient verbalized understanding of plan.  Ddx: these are considered less likely due to history of present illness and physical exam -CVA/ICH/intracranial mass: patient without neurodeficits; no history of seizure -Critical dehydration: BMP/CMP without concern -Sepsis/infection: SIRs criteria not met; patient afebrile without infectious symptoms -Electrolyte abnormality: BMP/CMP without concern  -Cardiac arrhythmia: EKG without changes from past   Social Determinants of Health:  none          Final Clinical Impression(s) / ED Diagnoses Final diagnoses:  None    Rx / DC Orders ED Discharge  Orders     None         Dorthy Cooler, [place] 06/09/23 1730    Anders Simmonds T, DO 06/10/23 0930

## 2023-06-09 NOTE — ED Notes (Signed)
The pt is upset that he has to go back home by city bus he asked if he could get an ambulance.  He usually drives  I have offered  him a bus ticket

## 2023-06-09 NOTE — Discharge Instructions (Addendum)
It was a pleasure caring for you today.  Workup today was reassuring.  Please follow-up with your primary care provider.  Seek emergency care experiencing any new or worsening symptoms.  Alternating between 650 mg Tylenol and 400 mg Advil: The best way to alternate taking Acetaminophen (example Tylenol) and Ibuprofen (example Advil/Motrin) is to take them 3 hours apart. For example, if you take ibuprofen at 6 am you can then take Tylenol at 9 am. You can continue this regimen throughout the day, making sure you do not exceed the recommended maximum dose for each drug.

## 2023-06-09 NOTE — ED Notes (Signed)
C/o neck pain.

## 2023-06-09 NOTE — ED Notes (Addendum)
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

## 2023-06-09 NOTE — ED Notes (Signed)
Lab called  need recollect for  bmp  bmp drawn to lab

## 2023-06-09 NOTE — ED Triage Notes (Signed)
Pt BIB [facility] from home for syncopal episode. Complaints of malaise, neck pain, dizziness . 2 L [place]. 30s HR 100/50 during syncope episode lasting less than a minute

## 2023-08-05 ENCOUNTER — Ambulatory Visit: Payer: No Typology Code available for payment source | Attending: Neurosurgery | Admitting: Physical Therapy

## 2023-08-05 ENCOUNTER — Other Ambulatory Visit: Payer: Self-pay

## 2023-08-05 DIAGNOSIS — R252 Cramp and spasm: Secondary | ICD-10-CM | POA: Diagnosis present

## 2023-08-05 DIAGNOSIS — M542 Cervicalgia: Secondary | ICD-10-CM | POA: Diagnosis present
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
## 2023-08-10 ENCOUNTER — Ambulatory Visit: Payer: No Typology Code available for payment source

## 2023-08-12 ENCOUNTER — Ambulatory Visit: Payer: No Typology Code available for payment source

## 2023-08-16 ENCOUNTER — Ambulatory Visit: Payer: No Typology Code available for payment source

## 2023-08-16 DIAGNOSIS — R252 Cramp and spasm: Secondary | ICD-10-CM

## 2023-08-16 DIAGNOSIS — M542 Cervicalgia: Secondary | ICD-10-CM | POA: Diagnosis not present

## 2023-08-16 NOTE — Therapy (Signed)
OUTPATIENT PHYSICAL THERAPY CERVICAL TREATMENT   Patient Name: Jonathan Obrien MRN: [id number] DOB:06/17/1941, 83 y.o., male Today's Date: 08/16/2023  END OF SESSION:  PT End of Session - 08/16/23 1018     Visit Number 2    Number of Visits 15    Date for PT Re-Evaluation 09/30/23    Authorization Type [facility]    Authorization - Number of Visits 15    Progress Note Due on Visit 10    PT Start Time 0933    PT Stop Time 1016    PT Time Calculation (min) 43 min    Activity Tolerance Patient tolerated treatment well    Behavior During Therapy [facility] for tasks assessed/performed              Past Medical History:  Diagnosis Date   Hypertension    Past Surgical History:  Procedure Laterality Date   ANAL FISSURE REPAIR     COLONOSCOPY     left leg surgery  1964   Patient Active Problem List   Diagnosis Date Noted   Bilateral hand numbness 06/30/2022   Spinal stenosis in cervical region 03/19/2022   Facet arthropathy, cervical 03/19/2022   Plantar fasciitis of left foot 01/16/2014   Deformity of metatarsal bone of left foot 01/16/2014   Equinus deformity of foot 01/16/2014   Bruit 12/20/2013   Medicare annual wellness visit, subsequent 11/15/2013   Prostate cancer screening 11/15/2013   HTN (hypertension) 10/31/2013   RBBB 10/26/2013   Hyperlipidemia 05/02/2012   Radicular pain of left lower extremity 05/02/2012   Arthritis 04/06/2012   Essential hypertension 09/17/2011    PCP: [facility]   REFERRING PROVIDER: Donalee Citrin, MD  REFERRING DIAG: [phone] Lumbar spondylosis (while order was for lumbar noted focus of notes was cervical spine and appt. Was follow up for neck pain, referring complaint from [facility] paperwork was also cervical radiculopathy.  Will contact MD for updated order for cervical spondylosis.  THERAPY DIAG:  Cervicalgia  Cramp and spasm  Rationale for Evaluation and Treatment: Rehabilitation  ONSET DATE: chronic since 1964 when got out of  [employer].  ED on 08/03/2023 for neck pain exacerbation.   SUBJECTIVE:                                                                                                                                                                                                         SUBJECTIVE STATEMENT: Pt reports no change in pain as of today. Hand dominance: Left  PERTINENT HISTORY:  Chronic neck and back pain, cervical  spondylosis, cervical radiculopathy, carpal tunnel syndrome, HTN  PAIN:  Are you having pain? Yes: NPRS scale: 7-8/10 Pain location: right side of neck  Pain description: sharp Aggravating factors: moving neck, 10 at worst, turning to the far right Relieving factors: holding neck still, rubbing salve on it, heat  PRECAUTIONS: None  RED FLAGS: None     WEIGHT BEARING RESTRICTIONS: No  FALLS:  Has patient fallen in last 6 months?  Feels off balance at times  LIVING ENVIRONMENT: Lives with: lives with an adult companion Lives in: House/apartment Stairs: No Has following equipment at home: None  OCCUPATION: retired  PLOF: Leisure: reading, golf  PATIENT GOALS: decrease pain, be able to play golf again  NEXT MD VISIT: Not sure  OBJECTIVE:   DIAGNOSTIC FINDINGS:  08/03/2023 CT cervical spine  Vertebral body heights are preserved.  There is reversal of the normal cervical lordosis centered at C6. Cervical spondylosis is present, most pronounced at C6-7 with disc space narrowing, endplate sclerosis, and osteophytosis. Facet joint hypertrophy is present, most pronounced on the right at C5-6. No destructive osseous lesions.   PATIENT SURVEYS:  NDI 29/50  COGNITION: Overall cognitive status: History of cognitive impairments - at baseline  SENSATION: WNL except reports slight numbness in L 3rd finger.   POSTURE:  sits with head rotated to the left  PALPATION: Tenderness/tightness bil UT, levator scapulae, cervical paraspinals   CERVICAL ROM:   Active ROM  A/PROM (deg) eval  Flexion 53  Extension 15p!  Right lateral flexion 20p!  Left lateral flexion 25p!  Right rotation 15p!  Left rotation 75   (Blank rows = not tested)  UPPER EXTREMITY ROM:  Active ROM Right eval Left eval  Shoulder flexion    Shoulder extension    Shoulder internal rotation    Shoulder external rotation    Elbow flexion    Elbow extension     (Blank rows = not tested)  UPPER EXTREMITY MMT:    MMT Right eval Left eval  Shoulder flexion 5 5  Shoulder extension    Shoulder abduction 5 5  Shoulder internal rotation 5 5  Shoulder external rotation 5 5  Elbow flexion 5 5  Elbow extension 5 5  Wrist flexion 5 5  Wrist extension 5 5  Grip strength good good   (Blank rows = not tested)  CERVICAL SPECIAL TESTS:  Spurling's test: Positive  FUNCTIONAL TESTS:  NT  TODAY'S TREATMENT:                                                                                                                              DATE:  08/16/23 Therapeutic Exercise: to improve strength and mobility.  Demo, verbal and tactile cues throughout for technique.  UBE x 6 min fwd Seated cervical retraction 10x3" Seated scap retraction x 20 Seated shoulder rolls x 10 Seated SNAG for extension and rotation with pillowcase Manual Therapy: to decrease muscle spasm, pain and improve mobility.  STM to R UT,LS, cervical paraspinals - improved ROM afterward  08/05/2023  EVAL Self Care: Findings, POC, initial HEP for TMR based "yummy" movements for pain free ROM neck movements starting with turning to left, reported decreased pain following.     PATIENT EDUCATION:  Education details: see self care Person educated: Patient Education method: Medical illustrator Education comprehension: verbalized understanding and returned demonstration  HOME EXERCISE PROGRAM: Access Code: [email or web address] URL: [email or web address] Date: 08/16/2023 Prepared by: Verta Ellen  Exercises - Seated Cervical Retraction  - 1 x daily - 7 x weekly - 3 sets - 10 reps - Seated Scapular Retraction  - 1 x daily - 7 x weekly - 3 sets - 10 reps - Seated Shoulder Rolls  - 1 x daily - 7 x weekly - 3 sets - 10 reps - Seated Assisted Cervical Rotation with Towel  - 1 x daily - 7 x weekly - 2 sets - 5 reps - Cervical Extension AROM with Strap  - 1 x daily - 7 x weekly - 2 sets - 5 reps   ASSESSMENT:  CLINICAL IMPRESSION: Patient is a 83 y.o. left hand dominant male who was seen today for physical therapy treatment for neck pain.  Good tolerance for exercises but cues to relax and target the correct muscles. He showed improved neck mobility after MT, I also gave him instruction on how to do self STM at home.  Clyda Greener would benefit from skilled physical therapy to decrease pain and improve quality of life.    OBJECTIVE IMPAIRMENTS: decreased activity tolerance, decreased ROM, increased fascial restrictions, impaired perceived functional ability, impaired flexibility, impaired sensation, and pain.   ACTIVITY LIMITATIONS: carrying, lifting, bending, sitting, dressing, and hygiene/grooming  PARTICIPATION LIMITATIONS: driving and community activity  PERSONAL FACTORS: Age, Past/current experiences, Time since onset of injury/illness/exacerbation, and 1-2 comorbidities: cervical spondylosis, CTS, history of confusion  are also affecting patient's functional outcome.   REHAB POTENTIAL: Good  CLINICAL DECISION MAKING: Evolving/moderate complexity  EVALUATION COMPLEXITY: Moderate   GOALS: Goals reviewed with patient? Yes  SHORT TERM GOALS: Target date: 08/19/2023   Patient will be independent with initial HEP.  Baseline:  Goal status: INITIAL  2.   Patient will be able to sit comfortable with head in midline.  Baseline: turns head to left ~ 20 deg due to pain Goal status: INITIAL    LONG TERM GOALS: Target date: 09/30/2023   Patient will be independent with  advanced/ongoing HEP to improve outcomes and carryover.  Baseline:  Goal status: INITIAL  2.  Patient will report 75% improvement in neck pain to improve QOL.  Baseline:  Goal status: INITIAL  3.  Patient will demonstrate full pain free cervical ROM for safety with driving.  Baseline: see objective, unable to drive currently Goal status: INITIAL  4.  Patient will report at least 8 points improvement on NDI to demonstrate improved functional ability.  Baseline: 29/50 Goal status: INITIAL  5.  Patient will be able to return to activities like golf without limitation from neck pain.  Baseline: had to stop due to pain Goal status: INITIAL  6. Patient will be able to read without increased neck pain for 30 min    Baseline: enjoys reading but causes severe neck pain Goal status: INITIAL   PLAN:  PT FREQUENCY: 1-2x/week  PT DURATION: 8 weeks  PLANNED INTERVENTIONS: 97110-Therapeutic exercises, 97530- Therapeutic activity, 97112- Neuromuscular re-education, 97535- Self Care, 16109- Manual therapy, 97014- Electrical stimulation (unattended), 97035- Ultrasound, 60454-  Traction (mechanical), Patient/Family education, Taping, Dry Needling, Joint mobilization, Joint manipulation, Spinal manipulation, Spinal mobilization, Cryotherapy, and Moist heat  PLAN FOR NEXT SESSION: HEP for cervical/postural strengthening/stretches to tolerance, manual therapy (do not recommend TrDN at this time due to previous response and reports DN as cause of confusion), modalities - trial TENS, traction - both can be obtained from [place] if provide relief.     Darleene Cleaver, PTA 08/16/2023, 10:18 AM

## 2023-08-19 ENCOUNTER — Ambulatory Visit: Payer: No Typology Code available for payment source

## 2023-08-23 ENCOUNTER — Ambulatory Visit: Payer: No Typology Code available for payment source | Attending: Neurosurgery

## 2023-08-26 ENCOUNTER — Encounter: Payer: No Typology Code available for payment source | Admitting: Physical Therapy

## 2023-09-02 ENCOUNTER — Encounter: Payer: No Typology Code available for payment source | Admitting: Physical Therapy

## 2023-09-07 ENCOUNTER — Encounter (HOSPITAL_BASED_OUTPATIENT_CLINIC_OR_DEPARTMENT_OTHER): Payer: Self-pay | Admitting: Emergency Medicine

## 2023-09-07 ENCOUNTER — Emergency Department (HOSPITAL_BASED_OUTPATIENT_CLINIC_OR_DEPARTMENT_OTHER)
Admission: EM | Admit: 2023-09-07 | Discharge: 2023-09-07 | Disposition: A | Discharge status: Home / Self Care | Payer: No Typology Code available for payment source | Attending: Emergency Medicine | Admitting: Emergency Medicine

## 2023-09-07 ENCOUNTER — Other Ambulatory Visit: Payer: Self-pay

## 2023-09-07 DIAGNOSIS — M542 Cervicalgia: Secondary | ICD-10-CM | POA: Diagnosis present

## 2023-09-07 DIAGNOSIS — Z87891 Personal history of nicotine dependence: Secondary | ICD-10-CM | POA: Insufficient documentation

## 2023-09-07 DIAGNOSIS — I10 Essential (primary) hypertension: Secondary | ICD-10-CM | POA: Diagnosis not present

## 2023-09-07 MED ORDER — LIDOCAINE 5 % EX PTCH
1.0000 | MEDICATED_PATCH | CUTANEOUS | Status: DC
  Administered 2023-09-07: 1 via TRANSDERMAL
  Filled 2023-09-07: qty 1

## 2023-09-07 MED ORDER — ACETAMINOPHEN 500 MG PO TABS
1000.0000 mg | ORAL_TABLET | Freq: Once | ORAL | Status: AC
  Administered 2023-09-07: 1000 mg via ORAL
  Filled 2023-09-07: qty 2

## 2023-09-07 MED ORDER — METHOCARBAMOL 500 MG PO TABS
1000.0000 mg | ORAL_TABLET | Freq: Once | ORAL | Status: AC
  Administered 2023-09-07: 1000 mg via ORAL
  Filled 2023-09-07: qty 2

## 2023-09-07 MED ORDER — KETOROLAC TROMETHAMINE 15 MG/ML IJ SOLN
30.0000 mg | Freq: Once | INTRAMUSCULAR | Status: DC
  Filled 2023-09-07: qty 2

## 2023-09-07 MED ORDER — LIDOCAINE 5 % EX PTCH: 1.0000 | MEDICATED_PATCH | CUTANEOUS | 0 refills | Status: DC

## 2023-09-07 NOTE — ED Notes (Signed)
 Jonathan Obrien called ED back and states she is getting ready and coming to the hospital to pick up patient for discharge.

## 2023-09-07 NOTE — ED Notes (Signed)
 Pt given sprite, mac n cheese, warm blanket, and lights turned down while waiting to attempt to call family again.

## 2023-09-07 NOTE — Discharge Instructions (Signed)
 You were evaluated in the Emergency Department and after careful evaluation, we did not find any emergent condition requiring admission or further testing in the hospital.  Your exam/testing today is overall reassuring.  Recommend continued follow-up with your regular doctors, can use Tylenol at home for discomfort.  Can also use the numbing patches provided.  Please return to the Emergency Department if you experience any worsening of your condition.   Thank you for allowing [place] to be a part of your care.

## 2023-09-07 NOTE — ED Notes (Signed)
 Pt came out to nurse's station asking for help using phone.  Pt unsteady on feet and assisted back to room by this RN.  Pt lying in bed, call bell in place, instructed not to get up without using call bell and assistance while in treatment room, pt gives verbal acknowledgement to this.

## 2023-09-07 NOTE — ED Provider Notes (Signed)
 [facility] Emergency Department Provider Note MRN:  [id number]  Arrival date & time: 09/07/23     Chief Complaint   Neck pain History of Present Illness   Jonathan Obrien is a 83 y.o. year-old male with a history of hypertension presenting to the ED with chief complaint of neck pain.  Bed neck pain all day today.  Explains that he has a history of arthritis in his neck.  Aching pain not going away.  No numbness or weakness to the arms or legs, no bowel or bladder dysfunction, no trauma.  Review of Systems  A thorough review of systems was obtained and all systems are negative except as noted in the HPI and PMH.   Patient's Health History    Past Medical History:  Diagnosis Date   Hypertension     Past Surgical History:  Procedure Laterality Date   ANAL FISSURE REPAIR     COLONOSCOPY     left leg surgery  1964    Family History  Problem Relation Age of Onset   Diabetes Brother    Prostate cancer Neg Hx    Hypertension Neg Hx    Breast cancer Neg Hx    Colon cancer Neg Hx    Heart disease Neg Hx     Social History   Socioeconomic History   Marital status: Single    Spouse name: Not on file   Number of children: 4   Years of education: Not on file   Highest education level: Not on file  Occupational History   Occupation: [employer]    Employer: [employer]  Tobacco Use   Smoking status: Former    Current packs/day: 0.00    Types: Cigarettes    Quit date: 03/12/1981    Years since quitting: 42.5   Smokeless tobacco: Never  Substance and Sexual Activity   Alcohol use: Yes    Comment: occasional   Drug use: Yes    Types: Marijuana   Sexual activity: Not on file  Other Topics Concern   Not on file  Social History Narrative   Not on file   Social Drivers of Health   Financial Resource Strain: Not on file  Food Insecurity: Not on file  Transportation Needs: Not on file  Physical Activity: Not on file  Stress: Not  on file  Social Connections: Unknown (12/02/2021)   Received [facility]   Social Network    Social Network: Not on file  Intimate Partner Violence: Not At Risk (08/03/2023)   Received from [facility]   HITS    Over the last 12 months how often did your partner physically hurt you?: Never    Over the last 12 months how often did your partner insult you or talk down to you?: Never    Over the last 12 months how often did your partner threaten you with physical harm?: Never    Over the last 12 months how often did your partner scream or curse at you?: Never     Physical Exam   Vitals:   09/07/23 0315  BP: (!) 160/81  Pulse: (!) 52  Resp: 16  Temp: 98.3 F (36.8 C)  SpO2: 100%    CONSTITUTIONAL: Well-appearing, NAD NEURO/PSYCH:  Alert and oriented x 3, no focal deficits EYES:  eyes equal and reactive ENT/NECK:  no LAD, no JVD CARDIO: Regular rate, well-perfused, normal S1 and S2 PULM:  CTAB no wheezing or rhonchi  GI/GU:  non-distended, non-tender MSK/SPINE:  No gross deformities, no edema SKIN:  no rash, atraumatic   *Additional and/or pertinent findings included in MDM below  Diagnostic and Interventional Summary    EKG Interpretation Date/Time:    Ventricular Rate:    PR Interval:    QRS Duration:    QT Interval:    QTC Calculation:   R Axis:      Text Interpretation:         Labs Reviewed - No data to display  No orders to display    Medications  ketorolac (TORADOL) 15 MG/ML injection 30 mg (30 mg Intramuscular Patient Refused/Not Given 09/07/23 0404)  lidocaine (LIDODERM) 5 % 1 patch (1 patch Transdermal Patch Applied 09/07/23 0400)  acetaminophen (TYLENOL) tablet 1,000 mg (1,000 mg Oral Given 09/07/23 0401)  methocarbamol (ROBAXIN) tablet 1,000 mg (1,000 mg Oral Given 09/07/23 0401)     Procedures  /  Critical Care Procedures  ED Course and Medical Decision Making  Initial Impression and Ddx No red flag symptoms to suggest  myelopathy, pain is across the posterior aspect of the neck consistent with MSK related pain.  No chest pain or shortness of breath, normal neuroexam.  Providing symptomatic management.  Past medical/surgical history that increases complexity of ED encounter: Hypertension  Interpretation of Diagnostics Laboratory and/or imaging options to aid in the diagnosis/care of the patient were considered.  After careful history and physical examination, it was determined that there was no indication for diagnostics at this time.  Patient Reassessment and Ultimate Disposition/Management     Patient feeling better and wanting to go home.  There was some concerns brought up by nursing that patient may have some underlying dementia that is not diagnosed.  He seems rather sharp to me and he answers all questions appropriately, fully oriented and with capacity to make decisions.  And so patient is appropriate for discharge with return precautions.  Patient management required discussion with the following services or consulting groups:  None  Complexity of Problems Addressed Acute complicated illness or Injury  Additional Data Reviewed and Analyzed Further history obtained from: Prior labs/imaging results  Additional Factors Impacting ED Encounter Risk Prescriptions  Elmer Sow. Pilar Plate, MD [facility] Emergency [facility] [email or web address]  Final Clinical Impressions(s) / ED Diagnoses     ICD-10-CM   1. Neck pain  M54.2       ED Discharge Orders          Ordered    lidocaine (LIDODERM) 5 %  Every 24 hours        09/07/23 0503             Discharge Instructions Discussed with and Provided to Patient:     Discharge Instructions      You were evaluated in the Emergency Department and after careful evaluation, we did not find any emergent condition requiring admission or further testing in the hospital.  Your exam/testing today is overall reassuring.   Recommend continued follow-up with your regular doctors, can use Tylenol at home for discomfort.  Can also use the numbing patches provided.  Please return to the Emergency Department if you experience any worsening of your condition.   Thank you for allowing [place] to be a part of your care.       Sabas Sous, MD 09/07/23 [phone]

## 2023-09-07 NOTE — ED Notes (Addendum)
 Attempted to call pt's family, Delice Bison, again without answer.  HIPAA compliant voicemail left.

## 2023-09-07 NOTE — ED Triage Notes (Signed)
 Neck pain, states Hx of arthritis. Flaring up and pain is worse than normal.

## 2023-09-07 NOTE — ED Notes (Addendum)
 Pt is answering questions correctly but is confused how to get home.  Attempted to call Delice Bison (niece) with pt's permission at [phone].

## 2023-09-07 NOTE — ED Notes (Signed)
 Attempted to call pt's family again without answer.

## 2023-09-09 ENCOUNTER — Encounter: Payer: No Typology Code available for payment source | Admitting: Physical Therapy

## 2023-09-16 ENCOUNTER — Encounter: Payer: No Typology Code available for payment source | Admitting: Physical Therapy

## 2023-09-19 ENCOUNTER — Encounter (HOSPITAL_COMMUNITY): Payer: Self-pay

## 2023-09-19 ENCOUNTER — Emergency Department (HOSPITAL_COMMUNITY)
Admission: EM | Admit: 2023-09-19 | Discharge: 2023-09-19 | Disposition: A | Discharge status: Home / Self Care | Attending: Emergency Medicine | Admitting: Emergency Medicine

## 2023-09-19 ENCOUNTER — Other Ambulatory Visit: Payer: Self-pay

## 2023-09-19 DIAGNOSIS — N189 Chronic kidney disease, unspecified: Secondary | ICD-10-CM | POA: Insufficient documentation

## 2023-09-19 DIAGNOSIS — M542 Cervicalgia: Secondary | ICD-10-CM | POA: Diagnosis present

## 2023-09-19 MED ORDER — LIDOCAINE 5 % EX PTCH: 1.0000 | MEDICATED_PATCH | CUTANEOUS | 0 refills | Status: AC

## 2023-09-19 MED ORDER — LIDOCAINE 5 % EX PTCH
1.0000 | MEDICATED_PATCH | CUTANEOUS | Status: DC
  Administered 2023-09-19: 1 via TRANSDERMAL
  Filled 2023-09-19: qty 1

## 2023-09-19 NOTE — ED Triage Notes (Signed)
 Pt c.o neck, right shoulder and back pain. States his arthritis is flaring up

## 2023-09-19 NOTE — ED Provider Notes (Addendum)
 [place] EMERGENCY DEPARTMENT AT [facility] Provider Note   CSN: [id number] Arrival date & time: 09/19/23  1849     History  Chief Complaint  Patient presents with   Arthritis    Jonathan Obrien is a 83 y.o. male.  HPI     83 year old patient comes in chief complaint of neck pain. Patient has known history of cervical arthritis.  He states that his current pain started 2 days ago.  The pain is no specific trigger.  The pain is primarily in the neck.  He has no associated numbness, tingling or weakness.  He has intermittent flareup of this pain.  He follows up with doctors at the [place], in the past he is also seen spine doctors we have given him injections.  He was seen in the ER few days ago, states that he received a patch that was very helpful.  Home Medications Prior to Admission medications   Medication Sig Start Date End Date Taking? Authorizing Provider  amLODipine (NORVASC) 10 MG tablet Take 10 mg by mouth daily. Patient not taking: Reported on 08/05/2023    [provider]  diclofenac Sodium (VOLTAREN) 1 % GEL Apply 2 g topically 4 (four) times daily.    [provider]  DULoxetine (CYMBALTA) 60 MG capsule Take 60 mg by mouth daily. Patient not taking: Reported on 08/05/2023    [provider]  gabapentin (NEURONTIN) 100 MG capsule Take 100 mg by mouth 3 (three) times daily.    [provider]  lidocaine (LIDODERM) 5 % Place 1 patch onto the skin daily. Remove & Discard patch within 12 hours or as directed by MD 09/19/23   Derwood Kaplan, MD  lisinopril-hydrochlorothiazide (ZESTORETIC) 20-25 MG tablet Take 1 tablet by mouth daily. Patient not taking: Reported on 08/05/2023    [provider]  methocarbamol (ROBAXIN) 500 MG tablet Take 500 mg by mouth 4 (four) times daily.    [provider]  naproxen sodium (ALEVE) 220 MG tablet Take 220 mg by mouth 2 (two) times daily as needed (neck pain).    [provider]  omeprazole (PRILOSEC OTC) 20 MG tablet Take 20 mg by mouth daily. Patient not taking: Reported on 08/05/2023    [provider]  rosuvastatin (CRESTOR) 40 MG tablet Take 40 mg by mouth daily. Patient not taking: Reported on 08/05/2023    [provider]  sildenafil (VIAGRA) 100 MG tablet Take 100 mg by mouth daily as needed for erectile dysfunction. Take 1/2 table pending mouth as instructed as needed    [provider]  Vitamin D, Ergocalciferol, (DRISDOL) 1.25 MG (50000 UNIT) CAPS capsule Take 50,000 Units by mouth every 7 (seven) days.    [provider]      Allergies    Codeine and Cortizone-5 [hydrocortisone base]    Review of Systems   Review of Systems  All other systems reviewed and are negative.   Physical Exam Updated Vital Signs BP (!) 156/67 (BP Location: Right Arm)   Pulse (!) 49   Temp 97.8 F (36.6 C) (Oral)   Resp 13   SpO2 100%  Physical Exam Vitals and nursing note reviewed.  Constitutional:      Appearance: He is well-developed.  HENT:     Head: Atraumatic.  Eyes:     Extraocular Movements: Extraocular movements intact.     Pupils: Pupils are equal, round, and reactive to light.  Neck:     Comments: No meningismus.  However patient has lower C-spine tenderness and also palpable spasm, in the paraspinal region, right side.  Cardiovascular:     Rate and Rhythm: Normal rate.  Pulmonary:     Effort: Pulmonary effort is normal.  Musculoskeletal:     Cervical back: Neck supple.  Skin:    General: Skin is warm.  Neurological:     Mental Status: He is alert and oriented to person, place, and time.     ED Results / Procedures / Treatments   Labs (all labs ordered are listed, but only abnormal results are displayed) Labs Reviewed - No data to display  EKG None  Radiology No results found.  Procedures Procedures    Medications Ordered in ED Medications  lidocaine (LIDODERM) 5 % 1 patch (1  patch Transdermal Patch Applied 09/19/23 1957)    ED Course/ Medical Decision Making/ A&P                                 Medical Decision Making Risk Prescription drug management.   83 year old male comes in with chief complaint of neck pain.  I have reviewed patient's record including his previous MRI.  His exam is overall reassuring.  He has no concerning neurologic symptoms.  Differential diagnosis considered for this patient includes primarily flareup of his cervical arthritis, muscle spasms, spinal stenosis. There are no radicular symptoms at all, which is reassuring.  No indication for imaging at this time. Patient is comfortable going home with lidocaine patch.  He has been advised to follow-up with his doctors at the [place]. Given his CKD, he is apprehensive about NSAIDs. Final Clinical Impression(s) / ED Diagnoses Final diagnoses:  Arthralgia, cervical spine    Rx / DC Orders ED Discharge Orders          Ordered    lidocaine (LIDODERM) 5 %  Every 24 hours        09/19/23 2026              Derwood Kaplan, MD 09/19/23 2036    Derwood Kaplan, MD 09/19/23 2036

## 2023-09-23 ENCOUNTER — Encounter: Payer: No Typology Code available for payment source | Admitting: Physical Therapy

## 2024-01-10 ENCOUNTER — Other Ambulatory Visit: Payer: Self-pay

## 2024-01-10 ENCOUNTER — Emergency Department (HOSPITAL_BASED_OUTPATIENT_CLINIC_OR_DEPARTMENT_OTHER)

## 2024-01-10 ENCOUNTER — Encounter (HOSPITAL_BASED_OUTPATIENT_CLINIC_OR_DEPARTMENT_OTHER): Payer: Self-pay | Admitting: *Deleted

## 2024-01-10 ENCOUNTER — Emergency Department (HOSPITAL_BASED_OUTPATIENT_CLINIC_OR_DEPARTMENT_OTHER)
Admission: EM | Admit: 2024-01-10 | Discharge: 2024-01-11 | Disposition: A | Discharge status: Home / Self Care | Attending: Emergency Medicine | Admitting: Emergency Medicine

## 2024-01-10 DIAGNOSIS — M542 Cervicalgia: Secondary | ICD-10-CM | POA: Insufficient documentation

## 2024-01-10 DIAGNOSIS — Y92410 Unspecified street and highway as the place of occurrence of the external cause: Secondary | ICD-10-CM | POA: Diagnosis not present

## 2024-01-10 DIAGNOSIS — S12591A Other nondisplaced fracture of sixth cervical vertebra, initial encounter for closed fracture: Secondary | ICD-10-CM

## 2024-01-10 DIAGNOSIS — M546 Pain in thoracic spine: Secondary | ICD-10-CM | POA: Diagnosis not present

## 2024-01-10 DIAGNOSIS — M79645 Pain in left finger(s): Secondary | ICD-10-CM | POA: Insufficient documentation

## 2024-01-10 MED ORDER — TRAMADOL HCL 50 MG PO TABS: 50.0000 mg | ORAL_TABLET | Freq: Four times a day (QID) | ORAL | 0 refills | Status: AC | PRN
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
## 2024-01-10 NOTE — Discharge Instructions (Addendum)
 Cervical Facet Fracture Home Care Home Care Instructions for Cervical Facet Fracture Treated with Aspen Collar - Collar Use and Immobilization:   The Aspen collar must be worn at all times, including during sleep, to maintain cervical spine stability and prevent further injury. The collar should only be removed for skin care or hygiene, and this should be done with the neck supported and in a neutral position. Strict adherence to immobilization is critical, as noncompliance increases the risk of delayed instability and may necessitate surgical intervention.[1][2][3] - Skin Care and Hygiene:   Inspect the skin under the collar daily for signs of pressure sores, redness, or breakdown. Clean the skin and collar pads regularly, ensuring the skin is completely dry before reapplying the collar. If any skin irritation or breakdown develops, notify the care team promptly.[1] - Activity Restrictions:   Avoid any activities that require neck movement, lifting, or bending. Do not drive or operate heavy machinery while in the collar. Ambulation is generally permitted, but avoid falls or trauma.[1][2] - Pain Management:   Use prescribed analgesics as directed. Persistent or worsening pain, new neurological symptoms (such as numbness, tingling, or weakness), or any signs of instability should prompt immediate medical evaluation.[1][2][3] - Follow-Up and Imaging:   Attend all scheduled follow-up appointments. The [employer] recommends reassessment with dynamic cervical radiographs (flexion/extension views) at approximately 6 weeks to evaluate for stability and healing. The duration of collar use is typically 6-12 weeks, but this may be adjusted based on clinical and radiographic findings.[1][2][4] - Potential Complications:   Be aware of risks associated with prolonged collar use, including skin breakdown, dysphagia, aspiration, and musculoskeletal complications. Early recognition and intervention  are essential to minimize morbidity.[1][5] - When to Seek Immediate Care:   Seek urgent evaluation for new or worsening neurological symptoms, severe pain, collar malfunction, or evidence of skin breakdown that does not improve with routine care.[1][2][3] - Patient Education and Support:   Education regarding the importance of compliance and early reporting of complications is essential for optimal outcomes. Nonadherence to immobilization protocols is associated with increased risk of delayed instability and surgical intervention.[3][6] These instructions are based on the [employer] Best Practices Guidelines and current clinical literature regarding the conservative management of stable cervical facet fractures with rigid cervical orthosis.[1][2][3][4]

## 2024-01-11 NOTE — ED Notes (Signed)
 Pt discharged to the lobby. Cab priced  for self pay ($43) asking to sit in the lobby. C-collar in place, ambulatory with steady gait
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
## 2024-04-10 ENCOUNTER — Ambulatory Visit: Attending: Neurosurgery | Admitting: Physical Therapy
